# Patient Record
Sex: Male | Born: 1990 | Marital: Single | Smoking: Current some day smoker
Health system: Southern US, Community
[De-identification: ages and names within clinical notes are randomized; demographics above are authoritative.]

---
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
## 2011-01-29 ENCOUNTER — Emergency Department: Payer: Self-pay | Admitting: Unknown Physician Specialty

## 2011-12-15 ENCOUNTER — Emergency Department: Payer: Self-pay | Admitting: Emergency Medicine

## 2011-12-15 LAB — ETHANOL: Ethanol %: 0.25 % — ABNORMAL HIGH (ref 0.000–0.080)

## 2011-12-15 LAB — CBC
MCH: 29.2 pg (ref 26.0–34.0)
MCV: 86 fL (ref 80–100)
Platelet: 255 10*3/uL (ref 150–440)
RDW: 15.2 % — ABNORMAL HIGH (ref 11.5–14.5)
WBC: 10.2 10*3/uL (ref 3.8–10.6)

## 2011-12-15 LAB — BASIC METABOLIC PANEL
Anion Gap: 14 (ref 7–16)
BUN: 12 mg/dL (ref 7–18)
Chloride: 108 mmol/L — ABNORMAL HIGH (ref 98–107)
Co2: 23 mmol/L (ref 21–32)
Glucose: 99 mg/dL (ref 65–99)
Osmolality: 288 (ref 275–301)
Potassium: 3.6 mmol/L (ref 3.5–5.1)

## 2016-01-29 ENCOUNTER — Emergency Department
Admission: EM | Admit: 2016-01-29 | Discharge: 2016-01-29 | Disposition: A | Discharge status: Home / Self Care | Payer: BC Managed Care – PPO | Attending: Emergency Medicine | Admitting: Emergency Medicine

## 2016-01-29 ENCOUNTER — Encounter: Payer: Self-pay | Admitting: Emergency Medicine

## 2016-01-29 ENCOUNTER — Emergency Department: Payer: BC Managed Care – PPO

## 2016-01-29 DIAGNOSIS — Z23 Encounter for immunization: Secondary | ICD-10-CM | POA: Diagnosis not present

## 2016-01-29 DIAGNOSIS — F17200 Nicotine dependence, unspecified, uncomplicated: Secondary | ICD-10-CM | POA: Insufficient documentation

## 2016-01-29 DIAGNOSIS — S0081XA Abrasion of other part of head, initial encounter: Secondary | ICD-10-CM | POA: Diagnosis not present

## 2016-01-29 DIAGNOSIS — Y929 Unspecified place or not applicable: Secondary | ICD-10-CM | POA: Diagnosis not present

## 2016-01-29 DIAGNOSIS — S0093XA Contusion of unspecified part of head, initial encounter: Secondary | ICD-10-CM | POA: Diagnosis not present

## 2016-01-29 DIAGNOSIS — Y9389 Activity, other specified: Secondary | ICD-10-CM | POA: Insufficient documentation

## 2016-01-29 DIAGNOSIS — Y999 Unspecified external cause status: Secondary | ICD-10-CM | POA: Diagnosis not present

## 2016-01-29 DIAGNOSIS — S0990XA Unspecified injury of head, initial encounter: Secondary | ICD-10-CM | POA: Diagnosis present

## 2016-01-29 DIAGNOSIS — S022XXA Fracture of nasal bones, initial encounter for closed fracture: Secondary | ICD-10-CM | POA: Diagnosis not present

## 2016-01-29 MED ORDER — TETANUS-DIPHTH-ACELL PERTUSSIS 5-2.5-18.5 LF-MCG/0.5 IM SUSP
0.5000 mL | Freq: Once | INTRAMUSCULAR | Status: AC
  Administered 2016-01-29: 0.5 mL via INTRAMUSCULAR
  Filled 2016-01-29: qty 0.5

## 2016-01-29 NOTE — Discharge Instructions (Signed)
Head Injury, Adult You have a head injury. Headaches and throwing up (vomiting) are common after a head injury. It should be easy to wake up from sleeping. Sometimes you must stay in the hospital. Most problems happen within the first 24 hours. Side effects may occur up to 7-10 days after the injury.  WHAT ARE THE TYPES OF HEAD INJURIES? Head injuries can be as minor as a bump. Some head injuries can be more severe. More severe head injuries include:  A jarring injury to the brain (concussion).  A bruise of the brain (contusion). This mean there is bleeding in the brain that can cause swelling.  A cracked skull (skull fracture).  Bleeding in the brain that collects, clots, and forms a bump (hematoma). WHEN SHOULD I GET HELP RIGHT AWAY?   You are confused or sleepy.  You cannot be woken up.  You feel sick to your stomach (nauseous) or keep throwing up (vomiting).  Your dizziness or unsteadiness is getting worse.  You have very bad, lasting headaches that are not helped by medicine. Take medicines only as told by your doctor.  You cannot use your arms or legs like normal.  You cannot walk.  You notice changes in the black spots in the center of the colored part of your eye (pupil).  You have clear or bloody fluid coming from your nose or ears.  You have trouble seeing. During the next 24 hours after the injury, you must stay with someone who can watch you. This person should get help right away (call 911 in the [place] if you start to shake and are not able to control it (have seizures), you pass out, or you are unable to wake up. HOW CAN I PREVENT A HEAD INJURY IN THE FUTURE?  Wear seat belts.  Wear a helmet while bike riding and playing sports like football.  Stay away from dangerous activities around the house. WHEN CAN I RETURN TO NORMAL ACTIVITIES AND ATHLETICS? See your doctor before doing these activities. You should not do normal activities or play contact sports until 1  week after the following symptoms have stopped:  Headache that does not go away.  Dizziness.  Poor attention.  Confusion.  Memory problems.  Sickness to your stomach or throwing up.  Tiredness.  Fussiness.  Bothered by bright lights or loud noises.  Anxiousness or depression.  Restless sleep. MAKE SURE YOU:   Understand these instructions.  Will watch your condition.  Will get help right away if you are not doing well or get worse.   This information is not intended to replace advice given to you by your health care provider. Make sure you discuss any questions you have with your health care provider.   Document Released: 09/26/2008 Document Revised: 11/04/2014 Document Reviewed: 06/21/2013 Elsevier Interactive Patient Education 2016 Elsevier Inc.  Nasal Fracture A nasal fracture is a break or crack in the bones or cartilage of the nose. Minor breaks do not require treatment. These breaks usually heal on their own after about one month. Serious breaks may require surgery. CAUSES This injury is usually caused by a blunt injury to the nose. This type of injury often occurs from:  Contact sports.  Car accidents.  Falls.  Getting punched. SYMPTOMS Symptoms of this injury include:  Pain.  Swelling of the nose.  Bleeding from the nose.  Bruising around the nose or eyes. This may include having black eyes.  Crooked appearance of the nose. DIAGNOSIS This injury may  be diagnosed with a physical exam. The health care provider will gently feel the nose for signs of broken bones. He or she will look inside the nostrils to make sure that there is not a blood-filled swelling on the dividing wall between the nostrils (septal hematoma). X-rays of the nose may not show a nasal fracture even when one is present. In some cases, X-rays or a CT scan may be done 1-5 days after the injury. Sometimes, the health care provider will want to wait until the swelling has gone  down. TREATMENT Often, minor fractures that have caused no deformity do not require treatment. More serious fractures in which bones have moved out of position may require surgery, which will take place after the swelling is gone. Surgery will stabilize and align the fracture. In some cases, a health care provider may be able to reposition the bones without surgery. This may be done in the health care provider's office after medicine is given to numb the area (local anesthetic). HOME CARE INSTRUCTIONS  If directed, apply ice to the injured area:  Put ice in a plastic bag.  Place a towel between your skin and the bag.  Leave the ice on for 20 minutes, 2-3 times per day.  Take over-the-counter and prescription medicines only as told by your health care provider.  If your nose starts to bleed, sit in an upright position while you squeeze the soft parts of your nose against the dividing wall between your nostrils (septum) for 10 minutes.  Try to avoid blowing your nose.  Return to your normal activities as told by your health care provider. Ask your health care provider what activities are safe for you.  Avoid contact sports for 3-4 weeks or as told by your health care provider.  Keep all follow-up visits as told by your health care provider. This is important. SEEK MEDICAL CARE IF:  Your pain increases or becomes severe.  You continue to have nosebleeds.  The shape of your nose does not return to normal within 5 days.  You have pus draining out of your nose. SEEK IMMEDIATE MEDICAL CARE IF:  You have bleeding from your nose that does not stop after you pinch your nostrils closed for 20 minutes and keep ice on your nose.  You have clear fluid draining out of your nose.  You notice a grape-like swelling on the septum. This swelling is a collection of blood (hematoma) that must be drained to help prevent infection.  You have difficulty moving your eyes.  You have repeated  vomiting.   This information is not intended to replace advice given to you by your health care provider. Make sure you discuss any questions you have with your health care provider.   Document Released: 10/11/2000 Document Revised: 07/05/2015 Document Reviewed: 11/21/2014 Elsevier Interactive Patient Education [employer]2016 Elsevier Inc.
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
## 2017-06-20 ENCOUNTER — Emergency Department
Admission: EM | Admit: 2017-06-20 | Discharge: 2017-06-20 | Disposition: A | Discharge status: Home / Self Care | Payer: Worker's Compensation | Attending: Emergency Medicine | Admitting: Emergency Medicine

## 2017-06-20 ENCOUNTER — Emergency Department: Payer: Worker's Compensation

## 2017-06-20 ENCOUNTER — Encounter: Payer: Self-pay | Admitting: Emergency Medicine

## 2017-06-20 DIAGNOSIS — S61213A Laceration without foreign body of left middle finger without damage to nail, initial encounter: Secondary | ICD-10-CM | POA: Diagnosis not present

## 2017-06-20 DIAGNOSIS — Y93H3 Activity, building and construction: Secondary | ICD-10-CM | POA: Insufficient documentation

## 2017-06-20 DIAGNOSIS — F17228 Nicotine dependence, chewing tobacco, with other nicotine-induced disorders: Secondary | ICD-10-CM | POA: Diagnosis not present

## 2017-06-20 DIAGNOSIS — S61211A Laceration without foreign body of left index finger without damage to nail, initial encounter: Secondary | ICD-10-CM | POA: Insufficient documentation

## 2017-06-20 DIAGNOSIS — Y998 Other external cause status: Secondary | ICD-10-CM | POA: Insufficient documentation

## 2017-06-20 DIAGNOSIS — W312XXA Contact with powered woodworking and forming machines, initial encounter: Secondary | ICD-10-CM | POA: Insufficient documentation

## 2017-06-20 DIAGNOSIS — F17200 Nicotine dependence, unspecified, uncomplicated: Secondary | ICD-10-CM | POA: Insufficient documentation

## 2017-06-20 DIAGNOSIS — Y929 Unspecified place or not applicable: Secondary | ICD-10-CM | POA: Diagnosis not present

## 2017-06-20 DIAGNOSIS — Z23 Encounter for immunization: Secondary | ICD-10-CM | POA: Diagnosis not present

## 2017-06-20 MED ORDER — TETANUS-DIPHTH-ACELL PERTUSSIS 5-2.5-18.5 LF-MCG/0.5 IM SUSP
0.5000 mL | Freq: Once | INTRAMUSCULAR | Status: AC
  Administered 2017-06-20: 0.5 mL via INTRAMUSCULAR
  Filled 2017-06-20: qty 0.5

## 2017-06-20 MED ORDER — LIDOCAINE HCL (PF) 1 % IJ SOLN
5.0000 mL | Freq: Once | INTRAMUSCULAR | Status: AC
  Administered 2017-06-20: 5 mL via SUBCUTANEOUS

## 2017-06-20 MED ORDER — CEPHALEXIN 500 MG PO CAPS: 500.0000 mg | ORAL_CAPSULE | Freq: Four times a day (QID) | ORAL | 0 refills | Status: AC

## 2017-06-20 MED ORDER — LIDOCAINE HCL (PF) 1 % IJ SOLN
10.0000 mL | Freq: Once | INTRAMUSCULAR | Status: AC
  Administered 2017-06-20: 10 mL via INTRADERMAL
  Filled 2017-06-20: qty 10

## 2017-06-20 MED ORDER — LIDOCAINE HCL (PF) 1 % IJ SOLN
INTRAMUSCULAR | Status: AC
  Administered 2017-06-20: 5 mL via SUBCUTANEOUS
  Filled 2017-06-20: qty 5

## 2017-06-20 MED ORDER — OXYCODONE-ACETAMINOPHEN 5-325 MG PO TABS: 1.0000 | ORAL_TABLET | Freq: Four times a day (QID) | ORAL | 0 refills | Status: AC | PRN
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
## 2017-09-05 ENCOUNTER — Emergency Department
Admission: EM | Admit: 2017-09-05 | Discharge: 2017-09-05 | Disposition: A | Discharge status: Home / Self Care | Payer: BC Managed Care – PPO | Attending: Emergency Medicine | Admitting: Emergency Medicine

## 2017-09-05 ENCOUNTER — Emergency Department: Payer: BC Managed Care – PPO

## 2017-09-05 ENCOUNTER — Other Ambulatory Visit: Payer: Self-pay

## 2017-09-05 DIAGNOSIS — F10220 Alcohol dependence with intoxication, uncomplicated: Secondary | ICD-10-CM | POA: Insufficient documentation

## 2017-09-05 DIAGNOSIS — F1010 Alcohol abuse, uncomplicated: Secondary | ICD-10-CM | POA: Diagnosis not present

## 2017-09-05 DIAGNOSIS — Y908 Blood alcohol level of 240 mg/100 ml or more: Secondary | ICD-10-CM | POA: Diagnosis not present

## 2017-09-05 DIAGNOSIS — R45851 Suicidal ideations: Secondary | ICD-10-CM | POA: Insufficient documentation

## 2017-09-05 DIAGNOSIS — F1994 Other psychoactive substance use, unspecified with psychoactive substance-induced mood disorder: Secondary | ICD-10-CM

## 2017-09-05 DIAGNOSIS — F10920 Alcohol use, unspecified with intoxication, uncomplicated: Secondary | ICD-10-CM

## 2017-09-05 DIAGNOSIS — F17200 Nicotine dependence, unspecified, uncomplicated: Secondary | ICD-10-CM | POA: Diagnosis not present

## 2017-09-05 DIAGNOSIS — F10929 Alcohol use, unspecified with intoxication, unspecified: Secondary | ICD-10-CM | POA: Diagnosis present

## 2017-09-05 LAB — CBC
HCT: 48.6 % (ref 40.0–52.0)
HEMOGLOBIN: 16.8 g/dL (ref 13.0–18.0)
MCH: 28.6 pg (ref 26.0–34.0)
MCHC: 34.6 g/dL (ref 32.0–36.0)
MCV: 82.7 fL (ref 80.0–100.0)
PLATELETS: 315 10*3/uL (ref 150–440)
RBC: 5.88 MIL/uL (ref 4.40–5.90)
RDW: 14.3 % (ref 11.5–14.5)
WBC: 6.4 10*3/uL (ref 3.8–10.6)

## 2017-09-05 LAB — URINE DRUG SCREEN, QUALITATIVE (ARMC ONLY)
AMPHETAMINES, UR SCREEN: NOT DETECTED
Barbiturates, Ur Screen: NOT DETECTED
Benzodiazepine, Ur Scrn: NOT DETECTED
CANNABINOID 50 NG, UR ~~LOC~~: NOT DETECTED
Cocaine Metabolite,Ur ~~LOC~~: NOT DETECTED
MDMA (ECSTASY) UR SCREEN: NOT DETECTED
Methadone Scn, Ur: NOT DETECTED
OPIATE, UR SCREEN: NOT DETECTED
PHENCYCLIDINE (PCP) UR S: NOT DETECTED
Tricyclic, Ur Screen: NOT DETECTED

## 2017-09-05 LAB — COMPREHENSIVE METABOLIC PANEL
ALK PHOS: 59 U/L (ref 38–126)
ALT: 39 U/L (ref 17–63)
AST: 31 U/L (ref 15–41)
Albumin: 5.2 g/dL — ABNORMAL HIGH (ref 3.5–5.0)
Anion gap: 11 (ref 5–15)
BUN: 10 mg/dL (ref 6–20)
CALCIUM: 9.1 mg/dL (ref 8.9–10.3)
CO2: 21 mmol/L — ABNORMAL LOW (ref 22–32)
CREATININE: 1.09 mg/dL (ref 0.61–1.24)
Chloride: 109 mmol/L (ref 101–111)
GFR calc Af Amer: >60 mL/min (ref >60)
Glucose, Bld: 119 mg/dL — ABNORMAL HIGH (ref 65–99)
Potassium: 3.3 mmol/L — ABNORMAL LOW (ref 3.5–5.1)
Sodium: 141 mmol/L (ref 135–145)
Total Bilirubin: 0.8 mg/dL (ref 0.3–1.2)
Total Protein: 8.5 g/dL — ABNORMAL HIGH (ref 6.5–8.1)

## 2017-09-05 LAB — ETHANOL: ALCOHOL ETHYL (B): 329 mg/dL — AB (ref <10)

## 2017-09-05 LAB — SALICYLATE LEVEL: Salicylate Lvl: <7 mg/dL (ref 2.8–30.0)

## 2017-09-05 LAB — ACETAMINOPHEN LEVEL: Acetaminophen (Tylenol), Serum: <10 ug/mL — ABNORMAL LOW (ref 10–30)

## 2017-09-05 MED ORDER — HALOPERIDOL LACTATE 5 MG/ML IJ SOLN
5.0000 mg | Freq: Once | INTRAMUSCULAR | Status: AC
  Administered 2017-09-05: 5 mg via INTRAVENOUS

## 2017-09-05 MED ORDER — LORAZEPAM 2 MG/ML IJ SOLN
1.0000 mg | Freq: Once | INTRAMUSCULAR | Status: AC
  Administered 2017-09-05: 1 mg via INTRAVENOUS

## 2017-09-05 MED ORDER — DIPHENHYDRAMINE HCL 50 MG/ML IJ SOLN
25.0000 mg | Freq: Once | INTRAMUSCULAR | Status: AC
  Administered 2017-09-05: 25 mg via INTRAVENOUS

## 2017-09-05 MED ORDER — DIPHENHYDRAMINE HCL 50 MG/ML IJ SOLN
INTRAMUSCULAR | Status: AC
  Administered 2017-09-05: 25 mg via INTRAVENOUS
  Filled 2017-09-05: qty 1

## 2017-09-05 MED ORDER — SODIUM CHLORIDE 0.9 % IV BOLUS (SEPSIS)
1000.0000 mL | Freq: Once | INTRAVENOUS | Status: AC
  Administered 2017-09-05: 1000 mL via INTRAVENOUS

## 2017-09-05 MED ORDER — LORAZEPAM 2 MG/ML IJ SOLN
INTRAMUSCULAR | Status: AC
  Administered 2017-09-05: 1 mg via INTRAVENOUS
  Filled 2017-09-05: qty 1

## 2017-09-05 MED ORDER — HALOPERIDOL LACTATE 5 MG/ML IJ SOLN
INTRAMUSCULAR | Status: AC
  Administered 2017-09-05: 5 mg via INTRAVENOUS
  Filled 2017-09-05: qty 1

## 2017-09-05 NOTE — BH Assessment (Signed)
TTS and Psych consult to be completed once patient is awake and sober enough to participate.

## 2017-09-05 NOTE — BH Assessment (Signed)
Assessment Note  Erik Mccall is an 26 y.o. male who presents to the ER via law enforcement. Per ER notes, patient voiced SI with plan of been killed by law enforcement. Patient reports of having no memory of what took place prior to arriving. He states he drank a large amount of alcohol and he doesn't know the amount. He further reports of drinking two to three times a week. Upon arrival to the ER, patient was agitated and uncooperative.  With this writer patient denied SI/HI and AV/H. He reports of having one DWI and it was approximately five years ago.  During the interview, he was calm, cooperative and pleasant. He was able to provide appropriate answers to the questions.   Diagnosis: Alcohol Use Disorder, Severe  Past Medical History: History reviewed. No pertinent past medical history.  History reviewed. No pertinent surgical history.  Family History: No family history on file.  Social History:  reports that he has been smoking.  He uses smokeless tobacco. He reports that he drinks alcohol. His drug history is not on file.  Additional Social History:  Alcohol / Drug Use Pain Medications: See PTA Prescriptions: See PTA Over the Counter: See PTA History of alcohol / drug use?: Yes Longest period of sobriety (when/how long): Unable to quantify Substance #1 Name of Substance 1: Alcohol 1 - Age of First Use: 14 1 - Amount (size/oz): "2-40oz's to 15 pack of beer" 1 - Frequency: "One to two timess week" 1 - Duration: Unable to quantify 1 - Last Use / Amount: 09/04/2017  CIWA: CIWA-Ar BP: (!) 115/58 Pulse Rate: 75 COWS:    Allergies: No Known Allergies  Home Medications:  (Not in a hospital admission)  OB/GYN Status:  No LMP for male patient.  General Assessment Data Assessment unable to be completed: Yes [facility] Assessment: In system Is this a Tele or Face-to-Face Assessment?: Face-to-Face Is this an Initial Assessment or a Re-assessment  for this encounter?: Initial Assessment Marital status: Single Maiden name: n/a Is patient pregnant?: No Pregnancy Status: No Living Arrangements: Alone Can pt return to current living arrangement?: Yes Admission Status: Involuntary Is patient capable of signing voluntary admission?: No(Under IVC) Referral Source: Self/Family/Friend Insurance type: Scientist, research (physical sciences)BCBS  Medical Screening Exam [facility] ONLY) Medical Exam completed: Yes  Crisis Care Plan Living Arrangements: Alone Legal Guardian: Other:(Self) Name of Psychiatrist: Reports of none Name of Therapist: Reports of none  Education Status Is patient currently in school?: No Current Grade: n/a Highest grade of school patient has completed: n/a Name of school: n/a Contact person: n/a  Risk to self with the past 6 months Suicidal Ideation: No Has patient been a risk to self within the past 6 months prior to admission? : No Suicidal Intent: No Has patient had any suicidal intent within the past 6 months prior to admission? : No Is patient at risk for suicide?: No Suicidal Plan?: No Has patient had any suicidal plan within the past 6 months prior to admission? : No Access to Means: No What has been your use of drugs/alcohol within the last 12 months?: Alcohol Previous Attempts/Gestures: No How many times?: 0 Other Self Harm Risks: Active Addiction Triggers for Past Attempts: None known Intentional Self Injurious Behavior: None Family Suicide History: No Recent stressful life event(s): Other (Comment)(Alcohol Use) Persecutory voices/beliefs?: No Depression: No Depression Symptoms: Isolating Substance abuse history and/or treatment for substance abuse?: Yes Suicide prevention information given to non-admitted patients: Not applicable  Risk to  Others within the past 6 months Homicidal Ideation: No Does patient have any lifetime risk of violence toward others beyond the six months prior to admission? : No Thoughts of Harm to  Others: No Current Homicidal Intent: No Current Homicidal Plan: No Access to Homicidal Means: No Identified Victim: Reports of none History of harm to others?: No Assessment of Violence: None Noted Violent Behavior Description: Reports of none Does patient have access to weapons?: No Criminal Charges Pending?: No Does patient have a court date: No Is patient on probation?: Yes  Psychosis Hallucinations: None noted Delusions: None noted  Mental Status Report Appearance/Hygiene: Unremarkable, In scrubs Eye Contact: Good Motor Activity: Freedom of movement, Unremarkable Speech: Logical/coherent, Unremarkable Level of Consciousness: Alert Mood: Pleasant Affect: Appropriate to circumstance Anxiety Level: None Thought Processes: Coherent, Relevant Judgement: Unimpaired Orientation: Person, Place, Time, Situation, Appropriate for developmental age Obsessive Compulsive Thoughts/Behaviors: None  Cognitive Functioning Concentration: Normal Memory: Remote Intact, Recent Impaired IQ: Average Insight: Fair Impulse Control: Fair Appetite: Good Weight Loss: 0 Weight Gain: 0 Sleep: No Change Total Hours of Sleep: 7 Vegetative Symptoms: None  ADLScreening [facility] Assessment Services) Patient's cognitive ability adequate to safely complete daily activities?: Yes Patient able to express need for assistance with ADLs?: Yes Independently performs ADLs?: Yes (appropriate for developmental age)  Prior Inpatient Therapy Prior Inpatient Therapy: No Prior Therapy Dates: Reports of none Prior Therapy Facilty/Provider(s): Reports of none Reason for Treatment: Reports of none  Prior Outpatient Therapy Prior Outpatient Therapy: Yes Prior Therapy Dates: 2013 Prior Therapy Facilty/Provider(s): Unable to remember the name Reason for Treatment: DWI Classes Does patient have an ACCT team?: No Does patient have [facility] services? : No Does patient have P4CC services?: No  ADL Screening  (condition at time of admission) Patient's cognitive ability adequate to safely complete daily activities?: Yes Is the patient deaf or have difficulty hearing?: No Does the patient have difficulty concentrating, remembering, or making decisions?: No Patient able to express need for assistance with ADLs?: Yes Does the patient have difficulty dressing or bathing?: No Independently performs ADLs?: Yes (appropriate for developmental age) Does the patient have difficulty walking or climbing stairs?: No Weakness of Legs: None Weakness of Arms/Hands: None     Therapy Consults (therapy consults require a physician order) PT Evaluation Needed: No OT Evalulation Needed: No SLP Evaluation Needed: No       Advance Directives (For Healthcare) Does Patient Have a Medical Advance Directive?: No    Additional Information 1:1 In Past 12 Months?: No CIRT Risk: No Elopement Risk: No Does patient have medical clearance?: Yes  Child/Adolescent Assessment Running Away Risk: Denies(Patient is an adult)  Disposition:  Disposition Initial Assessment Completed for this Encounter: Yes Disposition of Patient: Pending Review with psychiatrist  On Site Evaluation by:   Reviewed with Physician:    Lilyan Gilfordalvin J. Charlcie Prisco MS, LCAS, LPC, NCC, CCSI Therapeutic Triage Specialist 09/05/2017 1:43 PM

## 2017-09-05 NOTE — ED Notes (Signed)
Pt lying in bed, appears to be asleep with even, unlabored respirations observed. Safety maintained. Will continue to monitor.

## 2017-09-05 NOTE — Discharge Instructions (Signed)
Please do not drink at all, or drink in moderation.  Please talk to your primary care physician about treating your alcohol use disorder.  Return to the emergency department if you develop severe pain, seizures, thoughts of hurting herself or anyone else, hallucinations, or any other symptoms concerning to you.

## 2017-09-05 NOTE — ED Notes (Addendum)
Dr Dolores FrameSung made aware of pt's critical ETOH as reported by lab: 329 mg/dL
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
## 2017-09-05 NOTE — ED Notes (Signed)
Pt to ED-[facility] room 7. Pt already asking how long he must stay and requesting discharge. Denies SI/HI/AVH, pain. Reports "I was just being stupid while I was drunk." Reports "I get drunk with my friends about 2 times a week when we play video games." Pt sarcastic with poor eye contact. Abrasion seen on right hand, bandages applied. Informed pt of safety checks every 15 minutes and security cameras in place. Verbalized understanding. Safety maintained. Will continue to monitor.

## 2017-09-05 NOTE — ED Notes (Signed)
Food/fluids provided. Awaiting discharge order/paperwork to send patient home. Safety maintained. Will continue to monitor.

## 2017-09-05 NOTE — ED Notes (Signed)
Pt continues to appear to be asleep with even, unlabored respirations. Safety maintained. Will continue to monitor.

## 2017-09-05 NOTE — ED Notes (Signed)
Pt lying in bed awake. Calm/cooperatvie at this time. Fluids provided. Safety maintained. Will continue to monitor.

## 2017-09-05 NOTE — ED Notes (Signed)
Provided and reviewed discharge paperwork. Pt verbalizes understanding. Denies SI/HI/AVH, pain. Belongings returned. Safety maintained. Pt's grandmother here to pick him up and transport home.

## 2017-09-05 NOTE — ED Notes (Signed)
Pt lying in bed awake, calm/cooperative at this time. Safety maintained. Will continue to monitor.

## 2017-09-05 NOTE — Consult Note (Signed)
[facility] Consult   Reason for Consult: Consult for 26 year old man who presented to the emergency room intoxicated making suicidal statements Referring Physician: Mariea Clonts Patient Identification: REEGAN Mccall MRN:  [id number] Principal Diagnosis: Alcohol abuse Diagnosis:   Patient Active Problem List   Diagnosis Date Noted  . Alcohol abuse [F10.10] 09/05/2017  . Substance induced mood disorder [facility]) [F19.94] 09/05/2017    Total Time spent with patient: 1 hour  Subjective:   Erik Mccall is a 26 y.o. male patient admitted with "I guess I drank too much".  HPI: Patient interviewed chart reviewed.  26 year old man presented last night extremely intoxicated blood alcohol level up around 400.  Reportedly making statements about wanting to "commit suicide by cop".  Patient has slept all day.  On interview today he says he has no memory of the circumstances of presentation.  He remembers the police picking him up at his home.  He has no memory as to why the police were called.  He admits that he drank an entire 750 mL bottle of liquor last night which is about twice his usual consumption.  Denies that he is using any other drugs.  Patient denies depression denies any other depressive symptoms denies any psychosis.  Completely denies any suicidal or homicidal ideation.  Social history: Lives with his family.  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  Medical history: No significant ongoing medical problems  Substance abuse history: Long-standing alcohol abuse with problems such as arrest and driving while intoxicated.  Has never made a long-term commitment to sobriety but has stopped for months at a time in the past.  Denies other drug use.  Past Psychiatric History: No history of suicide attempts no history of psychiatric hospitalization no history of psychiatric medicine.  No prior contact with mental health treatment.  Risk to Self: Suicidal Ideation:  No Suicidal Intent: No Is patient at risk for suicide?: No Suicidal Plan?: No Access to Means: No What has been your use of drugs/alcohol within the last 12 months?: Alcohol How many times?: 0 Other Self Harm Risks: Active Addiction Triggers for Past Attempts: None known Intentional Self Injurious Behavior: None Risk to Others: Homicidal Ideation: No Thoughts of Harm to Others: No Current Homicidal Intent: No Current Homicidal Plan: No Access to Homicidal Means: No Identified Victim: Reports of none History of harm to others?: No Assessment of Violence: None Noted Violent Behavior Description: Reports of none Does patient have access to weapons?: No Criminal Charges Pending?: No Does patient have a court date: No Prior Inpatient Therapy: Prior Inpatient Therapy: No Prior Therapy Dates: Reports of none Prior Therapy Facilty/Provider(s): Reports of none Reason for Treatment: Reports of none Prior Outpatient Therapy: Prior Outpatient Therapy: Yes Prior Therapy Dates: 2013 Prior Therapy Facilty/Provider(s): Unable to remember the name Reason for Treatment: DWI Classes Does patient have an ACCT team?: No Does patient have [facility] services? : No Does patient have P4CC services?: No  Past Medical History: History reviewed. No pertinent past medical history. History reviewed. No pertinent surgical history. Family History: No family history on file. Family Psychiatric  History: Sister has depression Social History:  Social History   Substance and Sexual Activity  Alcohol Use Yes     Social History   Substance and Sexual Activity  Drug Use Not on file    Social History   Socioeconomic History  . Marital status: Single    Spouse name: None  . Number of children: None  .  Years of education: None  . Highest education level: None  Social Needs  . Financial resource strain: None  . Food insecurity - worry: None  . Food insecurity - inability: None  . Transportation needs  - medical: None  . Transportation needs - non-medical: None  Occupational History  . None  Tobacco Use  . Smoking status: Current Some Day Smoker  . Smokeless tobacco: Current User  Substance and Sexual Activity  . Alcohol use: Yes  . Drug use: None  . Sexual activity: Yes  Other Topics Concern  . None  Social History Narrative  . None   Additional Social History:    Allergies:  No Known Allergies  Labs:  Results for orders placed or performed during the hospital encounter of 09/05/17 (from the past 48 hour(s))  Comprehensive metabolic panel     Status: Abnormal   Collection Time: 09/05/17  4:39 AM  Result Value Ref Range   Sodium 141 135 - 145 mmol/L   Potassium 3.3 (L) 3.5 - 5.1 mmol/L   Chloride 109 101 - 111 mmol/L   CO2 21 (L) 22 - 32 mmol/L   Glucose, Bld 119 (H) 65 - 99 mg/dL   BUN 10 6 - 20 mg/dL   Creatinine, Ser 1.09 0.61 - 1.24 mg/dL   Calcium 9.1 8.9 - 10.3 mg/dL   Total Protein 8.5 (H) 6.5 - 8.1 g/dL   Albumin 5.2 (H) 3.5 - 5.0 g/dL   AST 31 15 - 41 U/L   ALT 39 17 - 63 U/L   Alkaline Phosphatase 59 38 - 126 U/L   Total Bilirubin 0.8 0.3 - 1.2 mg/dL   GFR calc non Af Amer >60 >60 mL/min   GFR calc Af Amer >60 >60 mL/min    Comment: (NOTE) The eGFR has been calculated using the CKD EPI equation. This calculation has not been validated in all clinical situations. eGFR's persistently <60 mL/min signify possible Chronic Kidney Disease.    Anion gap 11 5 - 15  Ethanol     Status: Abnormal   Collection Time: 09/05/17  4:39 AM  Result Value Ref Range   Alcohol, Ethyl (B) 329 (HH) <10 mg/dL    Comment: CRITICAL RESULT CALLED TO, READ BACK BY AND VERIFIED WITH  BUTCH WOODS @ 0517 ON 09/05/2017 BY [facility]        LOWEST DETECTABLE LIMIT FOR SERUM ALCOHOL IS 10 mg/dL FOR MEDICAL PURPOSES ONLY   Salicylate level     Status: None   Collection Time: 09/05/17  4:39 AM  Result Value Ref Range   Salicylate Lvl <4.2 2.8 - 30.0 mg/dL  Acetaminophen level      Status: Abnormal   Collection Time: 09/05/17  4:39 AM  Result Value Ref Range   Acetaminophen (Tylenol), Serum <10 (L) 10 - 30 ug/mL    Comment:        THERAPEUTIC CONCENTRATIONS VARY SIGNIFICANTLY. A RANGE OF 10-30 ug/mL MAY BE AN EFFECTIVE CONCENTRATION FOR MANY PATIENTS. HOWEVER, SOME ARE BEST TREATED AT CONCENTRATIONS OUTSIDE THIS RANGE. ACETAMINOPHEN CONCENTRATIONS >150 ug/mL AT 4 HOURS AFTER INGESTION AND >50 ug/mL AT 12 HOURS AFTER INGESTION ARE OFTEN ASSOCIATED WITH TOXIC REACTIONS.   cbc     Status: None   Collection Time: 09/05/17  4:39 AM  Result Value Ref Range   WBC 6.4 3.8 - 10.6 K/uL   RBC 5.88 4.40 - 5.90 MIL/uL   Hemoglobin 16.8 13.0 - 18.0 g/dL   HCT 48.6 40.0 - 52.0 %  MCV 82.7 80.0 - 100.0 fL   MCH 28.6 26.0 - 34.0 pg   MCHC 34.6 32.0 - 36.0 g/dL   RDW 14.3 11.5 - 14.5 %   Platelets 315 150 - 440 K/uL  Urine Drug Screen, Qualitative     Status: None   Collection Time: 09/05/17  4:39 AM  Result Value Ref Range   Tricyclic, Ur Screen NONE DETECTED NONE DETECTED   Amphetamines, Ur Screen NONE DETECTED NONE DETECTED   MDMA (Ecstasy)Ur Screen NONE DETECTED NONE DETECTED   Cocaine Metabolite,Ur [place] NONE DETECTED NONE DETECTED   Opiate, Ur Screen NONE DETECTED NONE DETECTED   Phencyclidine (PCP) Ur S NONE DETECTED NONE DETECTED   Cannabinoid 50 Ng, Ur Needles NONE DETECTED NONE DETECTED   Barbiturates, Ur Screen NONE DETECTED NONE DETECTED   Benzodiazepine, Ur Scrn NONE DETECTED NONE DETECTED   Methadone Scn, Ur NONE DETECTED NONE DETECTED    Comment: (NOTE) 258  Tricyclics, urine               Cutoff 1000 ng/mL 200  Amphetamines, urine             Cutoff 1000 ng/mL 300  MDMA (Ecstasy), urine           Cutoff 500 ng/mL 400  Cocaine Metabolite, urine       Cutoff 300 ng/mL 500  Opiate, urine                   Cutoff 300 ng/mL 600  Phencyclidine (PCP), urine      Cutoff 25 ng/mL 700  Cannabinoid, urine              Cutoff 50 ng/mL 800  Barbiturates, urine              Cutoff 200 ng/mL 900  Benzodiazepine, urine           Cutoff 200 ng/mL 1000 Methadone, urine                Cutoff 300 ng/mL 1100 1200 The urine drug screen provides only a preliminary, unconfirmed 1300 analytical test result and should not be used for non-medical 1400 purposes. Clinical consideration and professional judgment should 1500 be applied to any positive drug screen result due to possible 1600 interfering substances. A more specific alternate chemical method 1700 must be used in order to obtain a confirmed analytical result.  1800 Gas chromato graphy / mass spectrometry (GC/MS) is the preferred 1900 confirmatory method.     No current facility-administered medications for this encounter.    Current Outpatient Medications  Medication Sig Dispense Refill  . oxyCODONE-acetaminophen (ROXICET) 5-325 MG tablet Take 1 tablet by mouth every 6 (six) hours as needed. 10 tablet 0    Musculoskeletal: Strength & Muscle Tone: within normal limits Gait & Station: normal Patient leans: N/A  Psychiatric Specialty Exam: Physical Exam  Nursing note and vitals reviewed. Constitutional: He appears well-developed and well-nourished.  HENT:  Head: Normocephalic and atraumatic.  Eyes: Conjunctivae are normal. Pupils are equal, round, and reactive to light.  Neck: Normal range of motion.  Cardiovascular: Regular rhythm and normal heart sounds.  Respiratory: Effort normal. No respiratory distress.  GI: Soft.  Musculoskeletal: Normal range of motion.  Neurological: He is alert.  Skin: Skin is warm and dry.  Psychiatric: His speech is normal and behavior is normal. Judgment and thought content normal. His affect is blunt. He exhibits abnormal recent memory.    Review of Systems  Constitutional: Negative.  HENT: Negative.   Eyes: Negative.   Respiratory: Negative.   Cardiovascular: Negative.   Gastrointestinal: Negative.   Musculoskeletal: Negative.   Skin: Negative.    Neurological: Negative.   Psychiatric/Behavioral: Positive for memory loss and substance abuse. Negative for depression, hallucinations and suicidal ideas. The patient is not nervous/anxious and does not have insomnia.     Blood pressure (!) 114/50, pulse 76, temperature 98.4 F (36.9 C), temperature source Oral, resp. rate 18, height 6' 4"  (1.93 m), weight 124.7 kg (275 lb), SpO2 98 %.Body mass index is 33.47 kg/m.  General Appearance: Disheveled  Eye Contact:  Fair  Speech:  Clear and Coherent  Volume:  Decreased  Mood:  Euthymic  Affect:  Constricted  Thought Process:  Coherent  Orientation:  Full (Time, Place, and Person)  Thought Content:  Logical  Suicidal Thoughts:  No  Homicidal Thoughts:  No  Memory:  Immediate;   Fair Recent;   Poor Remote;   Poor  Judgement:  Impaired  Insight:  Shallow  Psychomotor Activity:  Decreased  Concentration:  Concentration: Poor  Recall:  Poor  Fund of Knowledge:  Fair  Language:  Fair  Akathisia:  No  Handed:  Right  AIMS (if indicated):     Assets:  Housing Physical Health  ADL's:  Intact  Cognition:  WNL  Sleep:        Treatment Plan Summary: Plan 26 year old man who presented intoxicated allegedly talking about suicide by cop last night.  No evidence that he actually did anything to try to kill himself.  Completely denies suicidal ideation today.  Denies homicidal ideation.  Does not report any recent symptoms of depression psychosis or mania.  Appears to have just been intoxicated last night.  Not tremulous or delirious currently.  Patient was counseled about the obvious dangers of blackouts while drinking and strongly encouraged to consider getting back into substance abuse treatment.  Discontinue IVC.  Patient can be released from the emergency room.  Disposition: Patient does not meet criteria for psychiatric inpatient admission.  Alethia Berthold, MD 09/05/2017 5:33 PM

## 2017-09-05 NOTE — ED Triage Notes (Signed)
Pt arrives to ED via [facility] with c/o ETOH intoxication and threats of wanting to commit "suicide by cop". Pt arrives extremely intoxicated, somewhat uncooperative. Bilateral hands are covered with dried blood, large scratch to RIGHT chest, and abrasions on his back. Per [facility], pt is IVC'd.

## 2017-10-31 DIAGNOSIS — R45851 Suicidal ideations: Secondary | ICD-10-CM | POA: Insufficient documentation

## 2017-10-31 DIAGNOSIS — F10129 Alcohol abuse with intoxication, unspecified: Secondary | ICD-10-CM | POA: Diagnosis present

## 2017-10-31 DIAGNOSIS — F1994 Other psychoactive substance use, unspecified with psychoactive substance-induced mood disorder: Secondary | ICD-10-CM | POA: Insufficient documentation

## 2017-10-31 DIAGNOSIS — F1010 Alcohol abuse, uncomplicated: Secondary | ICD-10-CM | POA: Insufficient documentation

## 2017-10-31 DIAGNOSIS — F17200 Nicotine dependence, unspecified, uncomplicated: Secondary | ICD-10-CM | POA: Insufficient documentation

## 2017-10-31 DIAGNOSIS — F17228 Nicotine dependence, chewing tobacco, with other nicotine-induced disorders: Secondary | ICD-10-CM | POA: Diagnosis not present

## 2017-11-01 ENCOUNTER — Encounter: Payer: Self-pay | Admitting: Emergency Medicine

## 2017-11-01 ENCOUNTER — Emergency Department
Admission: EM | Admit: 2017-11-01 | Discharge: 2017-11-01 | Disposition: A | Discharge status: Home / Self Care | Payer: BC Managed Care – PPO | Attending: Emergency Medicine | Admitting: Emergency Medicine

## 2017-11-01 ENCOUNTER — Other Ambulatory Visit: Payer: Self-pay

## 2017-11-01 DIAGNOSIS — F1994 Other psychoactive substance use, unspecified with psychoactive substance-induced mood disorder: Secondary | ICD-10-CM

## 2017-11-01 DIAGNOSIS — F10920 Alcohol use, unspecified with intoxication, uncomplicated: Secondary | ICD-10-CM

## 2017-11-01 DIAGNOSIS — F1010 Alcohol abuse, uncomplicated: Secondary | ICD-10-CM

## 2017-11-01 LAB — COMPREHENSIVE METABOLIC PANEL
ALT: 34 U/L (ref 17–63)
AST: 37 U/L (ref 15–41)
Albumin: 5 g/dL (ref 3.5–5.0)
Alkaline Phosphatase: 67 U/L (ref 38–126)
Anion gap: 13 (ref 5–15)
BUN: 8 mg/dL (ref 6–20)
CHLORIDE: 107 mmol/L (ref 101–111)
CO2: 22 mmol/L (ref 22–32)
Calcium: 8.7 mg/dL — ABNORMAL LOW (ref 8.9–10.3)
Creatinine, Ser: 0.98 mg/dL (ref 0.61–1.24)
Glucose, Bld: 111 mg/dL — ABNORMAL HIGH (ref 65–99)
POTASSIUM: 3.6 mmol/L (ref 3.5–5.1)
Sodium: 142 mmol/L (ref 135–145)
Total Bilirubin: 0.5 mg/dL (ref 0.3–1.2)
Total Protein: 8.3 g/dL — ABNORMAL HIGH (ref 6.5–8.1)

## 2017-11-01 LAB — URINE DRUG SCREEN, QUALITATIVE (ARMC ONLY)
AMPHETAMINES, UR SCREEN: NOT DETECTED
Barbiturates, Ur Screen: NOT DETECTED
Benzodiazepine, Ur Scrn: NOT DETECTED
Cannabinoid 50 Ng, Ur ~~LOC~~: NOT DETECTED
Cocaine Metabolite,Ur ~~LOC~~: NOT DETECTED
MDMA (ECSTASY) UR SCREEN: NOT DETECTED
Methadone Scn, Ur: NOT DETECTED
Opiate, Ur Screen: NOT DETECTED
Phencyclidine (PCP) Ur S: NOT DETECTED
TRICYCLIC, UR SCREEN: NOT DETECTED

## 2017-11-01 LAB — CBC
HCT: 49.7 % (ref 40.0–52.0)
Hemoglobin: 17 g/dL (ref 13.0–18.0)
MCH: 28.6 pg (ref 26.0–34.0)
MCHC: 34.3 g/dL (ref 32.0–36.0)
MCV: 83.3 fL (ref 80.0–100.0)
PLATELETS: 293 10*3/uL (ref 150–440)
RBC: 5.97 MIL/uL — AB (ref 4.40–5.90)
RDW: 14.6 % — ABNORMAL HIGH (ref 11.5–14.5)
WBC: 6.5 10*3/uL (ref 3.8–10.6)

## 2017-11-01 LAB — SALICYLATE LEVEL

## 2017-11-01 LAB — ETHANOL: ALCOHOL ETHYL (B): 349 mg/dL — AB (ref <10)

## 2017-11-01 LAB — ACETAMINOPHEN LEVEL: Acetaminophen (Tylenol), Serum: <10 ug/mL — ABNORMAL LOW (ref 10–30)

## 2017-11-01 MED ORDER — GI COCKTAIL ~~LOC~~
30.0000 mL | Freq: Once | ORAL | Status: AC
  Administered 2017-11-01: 30 mL via ORAL
  Filled 2017-11-01: qty 30

## 2017-11-01 MED ORDER — ZIPRASIDONE MESYLATE 20 MG IM SOLR
10.0000 mg | Freq: Once | INTRAMUSCULAR | Status: AC
  Administered 2017-11-01: 10 mg via INTRAMUSCULAR

## 2017-11-01 MED ORDER — SODIUM CHLORIDE 0.9 % IV BOLUS (SEPSIS)
1000.0000 mL | Freq: Once | INTRAVENOUS | Status: AC
Start: 2017-11-01 — End: 2017-11-01
  Administered 2017-11-01: 1000 mL via INTRAVENOUS

## 2017-11-01 MED ORDER — THIAMINE HCL 100 MG/ML IJ SOLN
Freq: Once | INTRAVENOUS | Status: AC
  Administered 2017-11-01: 01:00:00 via INTRAVENOUS
  Filled 2017-11-01: qty 1000

## 2017-11-01 MED ORDER — VITAMIN B-1 100 MG PO TABS
100.0000 mg | ORAL_TABLET | Freq: Every day | ORAL | Status: DC
  Administered 2017-11-01: 100 mg via ORAL
  Filled 2017-11-01: qty 1

## 2017-11-01 MED ORDER — LORAZEPAM 2 MG/ML IJ SOLN: 0.0000 mg | Freq: Four times a day (QID) | INTRAMUSCULAR | Status: DC

## 2017-11-01 MED ORDER — ZIPRASIDONE MESYLATE 20 MG IM SOLR
INTRAMUSCULAR | Status: AC
  Administered 2017-11-01: 10 mg via INTRAMUSCULAR
  Filled 2017-11-01: qty 20

## 2017-11-01 NOTE — ED Notes (Signed)
Requested SOC computer from [facility] 

## 2017-11-01 NOTE — ED Notes (Signed)
[facility]  [phone]

## 2017-11-01 NOTE — ED Notes (Signed)
[facility]

## 2017-11-01 NOTE — Discharge Instructions (Signed)
Please follow-up for alcohol and substance abuse as well as any depression or anxiety or suicidal thoughts with outpatient psychiatry, recommended and given contact information for [facility].  Return to emerge department immediately for any worsening condition including any thoughts of wanting to hurt yourself or others.

## 2017-11-01 NOTE — ED Triage Notes (Signed)
Per Sheriffs office responded to pt intoxicated and sitting road wanting to get hit by a car.  Pt told police that pt has a gun and wanted LE to shoot him.

## 2017-11-01 NOTE — ED Notes (Signed)
SOC computer placed in patient's room

## 2017-11-01 NOTE — BH Assessment (Signed)
Assessment Note  Erik Mccall is an 27 y.o. male who presents to the ER due to voicing SI. Per the report of the patient, he's not suicidal and doesn't remember saying he was. However, he admits to when he is intoxicated, he doesn't remember what happens. Patient was recently in the ER (08/2017) with similar presentation. He became intoxicated and voiced SI. Patient initially reported he was unsure who called 911. However, while sharing what he remembered; leading up to arriving in the ER, he corrected it and said he spoke with the dispatcher, "[employer]on [employer] [place]. Yea, he was a asshole.So yes, I guess I did call them (911), hm. I need to stop drinking..."  During the interview, the patient was calm, cooperative and pleasant. He was able to acknowledge his alcohol use is problematic. "I need to leave that shit alone." Per his report, he typically drinks one to two time week. However, for the last two days he's drank two pints of liquor and "a lot of beers." He denies the use of any other mind-altering substances. He's tried "acid" once and used cannabis in the past. Due to receiving a possession charge, he no longer smoke THC. "I only fool with the legal shit." UDS reflects the same, negative of any substances. Upon arrival to the ER, BAC was 349. Previous ER visit, 08/2017, it was 329.  Patient currently denies SI/HI and AV/H.  Diagnosis: Alcohol Use Disorder, Severe  Past Medical History: History reviewed. No pertinent past medical history.  History reviewed. No pertinent surgical history.  Family History: History reviewed. No pertinent family history.  Social History:  reports that he has been smoking.  His smokeless tobacco use includes chew and snuff. He reports that he drinks alcohol. His drug history is not on file.  Additional Social History:  Alcohol / Drug Use Pain Medications: See PTA Prescriptions: See PTA Over the Counter: See PTA History of alcohol / drug use?: Yes Longest period  of sobriety (when/how long): Unable to quantify Negative Consequences of Use: Financial, Legal, Personal relationships, Work / School Substance #1 Name of Substance 1: Alcohol 1 - Age of First Use: 14 1 - Amount (size/oz): "2-40oz's to 15 pack of beer" 1 - Frequency: "One to two timess week" 1 - Duration: Unable to quantify 1 - Last Use / Amount: 11/01/2016  CIWA: CIWA-Ar BP: 140/90 Pulse Rate: 86 Nausea and Vomiting: no nausea and no vomiting Tactile Disturbances: none Tremor: no tremor Auditory Disturbances: not present Paroxysmal Sweats: no sweat visible Visual Disturbances: not present Anxiety: no anxiety, at ease Headache, Fullness in Head: none present Agitation: normal activity Orientation and Clouding of Sensorium: oriented and can do serial additions CIWA-Ar Total: 0 COWS:    Allergies: No Known Allergies  Home Medications:  (Not in a hospital admission)  OB/GYN Status:  No LMP for male patient.  General Assessment Data Location of Assessment: [facility] ED TTS Assessment: In system Is this a Tele or Face-to-Face Assessment?: Face-to-Face Is this an Initial Assessment or a Re-assessment for this encounter?: Initial Assessment Marital status: Single Maiden name: n/a Is patient pregnant?: No Pregnancy Status: No Living Arrangements: Spouse/significant other, Parent, Other (Comment)(Grandparents) Can pt return to current living arrangement?: Yes Admission Status: Involuntary Is patient capable of signing voluntary admission?: No(Under IVC) Referral Source: Self/Family/Friend Insurance type: Scientist, research (physical sciences) Exam [facility] Walk-in ONLY) Medical Exam completed: Yes  Crisis Care Plan Living Arrangements: Spouse/significant other, Parent, Other (Comment)(Grandparents) Legal Guardian: Other:(Self) Name of Psychiatrist: Reports of none  Name of Therapist: Reports of none  Education Status Is patient currently in school?: No Current Grade: n/a Highest grade of  school patient has completed: n/a Name of school: n/a Contact person: n/a  Risk to self with the past 6 months Suicidal Ideation: No Has patient been a risk to self within the past 6 months prior to admission? : No Suicidal Intent: No Has patient had any suicidal intent within the past 6 months prior to admission? : No Is patient at risk for suicide?: No Suicidal Plan?: No Has patient had any suicidal plan within the past 6 months prior to admission? : No Access to Means: No What has been your use of drugs/alcohol within the last 12 months?: Alcohol Previous Attempts/Gestures: No How many times?: 0 Other Self Harm Risks: Active Addiction Triggers for Past Attempts: None known Intentional Self Injurious Behavior: None Family Suicide History: No Recent stressful life event(s): Other (Comment)(Active Addiction) Persecutory voices/beliefs?: No Depression: No Depression Symptoms: (Reports of none) Substance abuse history and/or treatment for substance abuse?: Yes Suicide prevention information given to non-admitted patients: Not applicable  Risk to Others within the past 6 months Homicidal Ideation: No Does patient have any lifetime risk of violence toward others beyond the six months prior to admission? : No Thoughts of Harm to Others: No Current Homicidal Intent: No Current Homicidal Plan: No Access to Homicidal Means: No Identified Victim: Reports of none History of harm to others?: No Assessment of Violence: None Noted Violent Behavior Description: Reports of none Does patient have access to weapons?: No Criminal Charges Pending?: No Does patient have a court date: No Is patient on probation?: Yes  Psychosis Hallucinations: None noted Delusions: None noted  Mental Status Report Appearance/Hygiene: Unremarkable, In scrubs Eye Contact: Good Motor Activity: Freedom of movement, Unremarkable Speech: Logical/coherent, Unremarkable Level of Consciousness: Alert Mood:  Depressed, Anxious, Sad, Pleasant Affect: Appropriate to circumstance Anxiety Level: Minimal Thought Processes: Coherent, Relevant Judgement: Unimpaired Orientation: Person, Place, Time, Situation, Appropriate for developmental age Obsessive Compulsive Thoughts/Behaviors: None  Cognitive Functioning Concentration: Normal Memory: Remote Intact, Recent Impaired IQ: Average Insight: Fair Impulse Control: Fair Appetite: Good Weight Loss: 0 Weight Gain: 0 Sleep: No Change Total Hours of Sleep: 8 Vegetative Symptoms: None  ADLScreening [facility] Assessment Services) Patient's cognitive ability adequate to safely complete daily activities?: Yes Patient able to express need for assistance with ADLs?: Yes Independently performs ADLs?: Yes (appropriate for developmental age)  Prior Inpatient Therapy Prior Inpatient Therapy: No Prior Therapy Dates: Reports of none Prior Therapy Facilty/Provider(s): Reports of none Reason for Treatment: Reports of none  Prior Outpatient Therapy Prior Outpatient Therapy: Yes Prior Therapy Dates: 2013 Prior Therapy Facilty/Provider(s): Unable to remember the name Reason for Treatment: DWI Classes Does patient have an ACCT team?: No Does patient have Intensive In-House Services?  : No Does patient have [facility] services? : No Does patient have P4CC services?: No  ADL Screening (condition at time of admission) Patient's cognitive ability adequate to safely complete daily activities?: Yes Is the patient deaf or have difficulty hearing?: No Does the patient have difficulty seeing, even when wearing glasses/contacts?: No Does the patient have difficulty concentrating, remembering, or making decisions?: No Patient able to express need for assistance with ADLs?: Yes Does the patient have difficulty dressing or bathing?: No Independently performs ADLs?: Yes (appropriate for developmental age) Does the patient have difficulty walking or climbing stairs?:  No Weakness of Legs: None Weakness of Arms/Hands: None  Home Assistive Devices/Equipment Home Assistive Devices/Equipment: None  Therapy Consults (therapy consults require a physician order) PT Evaluation Needed: No OT Evalulation Needed: No SLP Evaluation Needed: No Abuse/Neglect Assessment (Assessment to be complete while patient is alone) Abuse/Neglect Assessment Can Be Completed: Yes Physical Abuse: Denies Verbal Abuse: Denies Sexual Abuse: Denies Exploitation of patient/patient's resources: Denies Self-Neglect: Denies Values / Beliefs Cultural Requests During Hospitalization: None Spiritual Requests During Hospitalization: None Consults Spiritual Care Consult Needed: No Social Work Consult Needed: No Merchant navy officer (For Healthcare) Does Patient Have a Medical Advance Directive?: No    Additional Information 1:1 In Past 12 Months?: No CIRT Risk: No Elopement Risk: No Does patient have medical clearance?: Yes  Child/Adolescent Assessment Running Away Risk: Denies(Patient is an adult)  Disposition:  Disposition Initial Assessment Completed for this Encounter: Yes Disposition of Patient: Pending Review with psychiatrist  On Site Evaluation by:   Reviewed with Physician:    Lilyan Gilford MS, LCAS, LPC, NCC, CCSI Therapeutic Triage Specialist 11/01/2017 10:30 AM

## 2017-11-01 NOTE — ED Notes (Signed)
Patient stated "I need to be out of here by 9 am so I can go to work, I don't want to be late" Patient was informed he could get a work excuse.  Shortly thereafter patient stated I need to get out of here so I can get my truck loaded, [place]

## 2017-11-01 NOTE — ED Notes (Signed)
Pt lying on stomach at this time with arms visualized. Equal and unlabored resp noted.

## 2017-11-01 NOTE — ED Notes (Signed)
Pt denies SI.  

## 2017-11-01 NOTE — ED Notes (Signed)
Pt continuing to lie on his stomach with arms visualized. Pt has unlabored resp noted, no signs of distress noted at this time.

## 2017-11-01 NOTE — ED Notes (Addendum)
With pt permission spoke to pt's grandmother, Eyvonne MechanicShirley Ellis, Rexene EdisonH: [phone] and C: [phone]  Ms Rennis Hardingllis reports for the last 2 years pt has been binge drinking (1 or 2 times per 2 weeks) pt drinks a 1/5 gallon of [employer]isky.  Pt tells family "I need help; I want to talk to someone" but doesn't get further than that, and only talks about such things when intoxicated.    Pt is on probation for Public Intoxication and drug charges.  Ms Rennis Hardingllis reports pt doesn't use drugs only dips snuff and ETOH abuse.  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  Pt called 911 tonight and told law enforcement that he was sitting in the middle of the road for cars to hit him, and that he had a knife and gun - with intention of law enforcement shooting pt when they arrived.  Pt became tearful in triage and admitted to this RN "I'm sad" but then refused to elaborate.  Pt maybe more forth coming one on one.

## 2017-11-01 NOTE — ED Notes (Signed)
SOC rescinded papers

## 2017-11-01 NOTE — ED Provider Notes (Signed)
[facility]  I accepted care from Dr. Dolores FrameSung ____________________________________________    LABS (pertinent positives/negatives)  I, Erik Rooksebecca Tyrianna Lightle, MD have personally reviewed the lab reports noted below.  Labs Reviewed  COMPREHENSIVE METABOLIC PANEL - Abnormal; Notable for the following components:      Result Value   Glucose, Bld 111 (*)    Calcium 8.7 (*)    Total Protein 8.3 (*)    All other components within normal limits  ETHANOL - Abnormal; Notable for the following components:   Alcohol, Ethyl (B) 349 (*)    All other components within normal limits  ACETAMINOPHEN LEVEL - Abnormal; Notable for the following components:   Acetaminophen (Tylenol), Serum <10 (*)    All other components within normal limits  CBC - Abnormal; Notable for the following components:   RBC 5.97 (*)    RDW 14.6 (*)    All other components within normal limits  SALICYLATE LEVEL  URINE DRUG SCREEN, QUALITATIVE ([facility] ONLY)     ____________________________________________    RADIOLOGY All xrays were viewed by me. Imaging interpreted by radiologist.  I, Erik Rooksebecca Taber Sweetser MD have personally reviewed the imaging report noted below.  None  ____________________________________________   PROCEDURES  Procedure(s) performed: None  Critical Care performed: None  ____________________________________________   INITIAL IMPRESSION / ASSESSMENT AND PLAN / ED COURSE   Pertinent labs & imaging results that were available during my care of the patient were reviewed by me and considered in my medical decision making (see chart for details).  I reviewed patient's specialist on call evaluation, patient was here for substance abuse and intoxication and suicidal ideation and is currently awake and alert and has no suicidal ideation for me.  Specialist on-call psychiatrist did recommend outpatient management and did fax over the reversal of his involuntary commitment.  I completed his  discharge instructions.     Patient / Family / Caregiver informed of clinical course, medical decision-making process, and agree with plan.   I discussed return precautions, follow-up instructions, and discharged instructions with patient and/or family.     ____________________________________________   FINAL CLINICAL IMPRESSION(S) / ED DIAGNOSES  Final diagnoses:  ETOH abuse  Alcoholic intoxication without complication (HCC)  Substance induced mood disorder (HCC)        Erik Mccall, Erik Lacek, MD 11/01/17 1150

## 2017-11-01 NOTE — ED Notes (Signed)
Pt sleeping at this time, pt on left side with equal rise and fall of chest noted.
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
## 2018-07-15 IMAGING — DX DG HAND COMPLETE 3+V*R*
1 series · 1 of 1 positions shown · non-contrast
Comparison: 01/29/2016

CLINICAL DATA: Intoxicated uncooperative. Both hands are covered
with dried blood.

EXAM:
RIGHT HAND - COMPLETE 3+ VIEW

[hand lat]
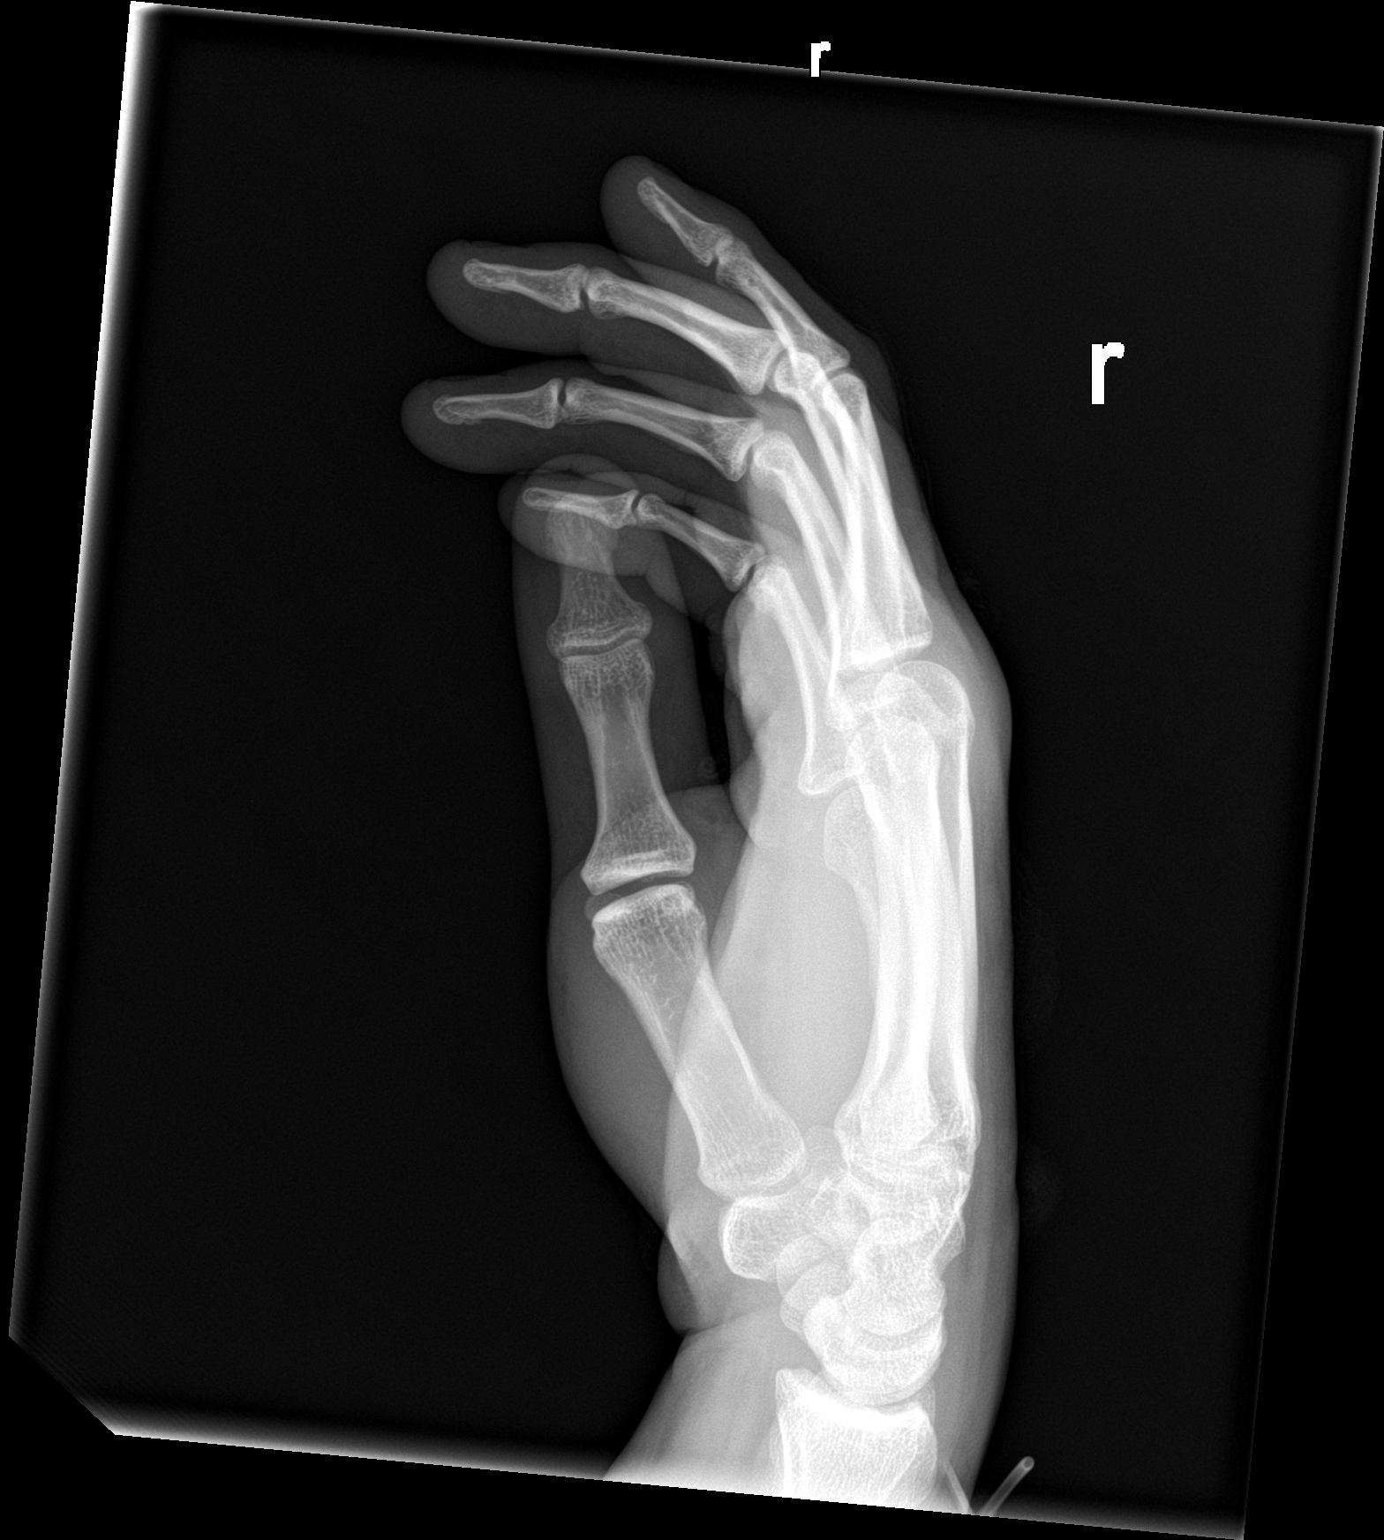

[1 of 1 positions shown; findings below may reference images not displayed]

FINDINGS: There is no evidence of fracture or dislocation. There is no
evidence of arthropathy or other focal bone abnormality. Soft
tissues are unremarkable.
IMPRESSION: Negative.

## 2020-12-13 ENCOUNTER — Emergency Department
Admission: EM | Admit: 2020-12-13 | Discharge: 2020-12-15 | Disposition: A | Discharge status: Home / Self Care | Payer: Self-pay | Attending: Emergency Medicine | Admitting: Emergency Medicine

## 2020-12-13 ENCOUNTER — Other Ambulatory Visit: Payer: Self-pay

## 2020-12-13 DIAGNOSIS — F17200 Nicotine dependence, unspecified, uncomplicated: Secondary | ICD-10-CM | POA: Insufficient documentation

## 2020-12-13 DIAGNOSIS — E876 Hypokalemia: Secondary | ICD-10-CM | POA: Insufficient documentation

## 2020-12-13 DIAGNOSIS — F1094 Alcohol use, unspecified with alcohol-induced mood disorder: Secondary | ICD-10-CM

## 2020-12-13 DIAGNOSIS — Y908 Blood alcohol level of 240 mg/100 ml or more: Secondary | ICD-10-CM | POA: Insufficient documentation

## 2020-12-13 DIAGNOSIS — Z20822 Contact with and (suspected) exposure to covid-19: Secondary | ICD-10-CM | POA: Insufficient documentation

## 2020-12-13 DIAGNOSIS — F1010 Alcohol abuse, uncomplicated: Secondary | ICD-10-CM | POA: Diagnosis present

## 2020-12-13 DIAGNOSIS — R45851 Suicidal ideations: Secondary | ICD-10-CM

## 2020-12-13 DIAGNOSIS — F332 Major depressive disorder, recurrent severe without psychotic features: Secondary | ICD-10-CM

## 2020-12-13 DIAGNOSIS — F1994 Other psychoactive substance use, unspecified with psychoactive substance-induced mood disorder: Secondary | ICD-10-CM | POA: Diagnosis present

## 2020-12-13 LAB — CBC
HCT: 48.8 % (ref 39.0–52.0)
Hemoglobin: 17.5 g/dL — ABNORMAL HIGH (ref 13.0–17.0)
MCH: 28.6 pg (ref 26.0–34.0)
MCHC: 35.9 g/dL (ref 30.0–36.0)
MCV: 79.9 fL — ABNORMAL LOW (ref 80.0–100.0)
Platelets: 372 10*3/uL (ref 150–400)
RBC: 6.11 MIL/uL — ABNORMAL HIGH (ref 4.22–5.81)
RDW: 12.8 % (ref 11.5–15.5)
WBC: 9.2 10*3/uL (ref 4.0–10.5)
nRBC: 0 % (ref 0.0–0.2)

## 2020-12-13 LAB — URINE DRUG SCREEN, QUALITATIVE (ARMC ONLY)
Amphetamines, Ur Screen: NOT DETECTED
Barbiturates, Ur Screen: NOT DETECTED
Benzodiazepine, Ur Scrn: NOT DETECTED
Cannabinoid 50 Ng, Ur ~~LOC~~: NOT DETECTED
Cocaine Metabolite,Ur ~~LOC~~: NOT DETECTED
MDMA (Ecstasy)Ur Screen: NOT DETECTED
Methadone Scn, Ur: NOT DETECTED
Opiate, Ur Screen: NOT DETECTED
Phencyclidine (PCP) Ur S: NOT DETECTED
Tricyclic, Ur Screen: NOT DETECTED

## 2020-12-13 LAB — RESP PANEL BY RT-PCR (FLU A&B, COVID) ARPGX2
Influenza A by PCR: NEGATIVE
Influenza B by PCR: NEGATIVE
SARS Coronavirus 2 by RT PCR: NEGATIVE

## 2020-12-13 LAB — COMPREHENSIVE METABOLIC PANEL
ALT: 28 U/L (ref 0–44)
AST: 25 U/L (ref 15–41)
Albumin: 5.1 g/dL — ABNORMAL HIGH (ref 3.5–5.0)
Alkaline Phosphatase: 64 U/L (ref 38–126)
Anion gap: 14 (ref 5–15)
BUN: 6 mg/dL (ref 6–20)
CO2: 19 mmol/L — ABNORMAL LOW (ref 22–32)
Calcium: 8.6 mg/dL — ABNORMAL LOW (ref 8.9–10.3)
Chloride: 104 mmol/L (ref 98–111)
Creatinine, Ser: 0.88 mg/dL (ref 0.61–1.24)
GFR, Estimated: >60 mL/min (ref >60)
Glucose, Bld: 139 mg/dL — ABNORMAL HIGH (ref 70–99)
Potassium: 3.1 mmol/L — ABNORMAL LOW (ref 3.5–5.1)
Sodium: 137 mmol/L (ref 135–145)
Total Bilirubin: 0.6 mg/dL (ref 0.3–1.2)
Total Protein: 8.3 g/dL — ABNORMAL HIGH (ref 6.5–8.1)

## 2020-12-13 LAB — MAGNESIUM: Magnesium: 2.2 mg/dL (ref 1.7–2.4)

## 2020-12-13 LAB — ETHANOL: Alcohol, Ethyl (B): 264 mg/dL — ABNORMAL HIGH (ref <10)

## 2020-12-13 LAB — ACETAMINOPHEN LEVEL: Acetaminophen (Tylenol), Serum: <10 ug/mL — ABNORMAL LOW (ref 10–30)

## 2020-12-13 LAB — SALICYLATE LEVEL: Salicylate Lvl: <7 mg/dL — ABNORMAL LOW (ref 7.0–30.0)

## 2020-12-13 MED ORDER — LORAZEPAM 2 MG PO TABS
0.0000 mg | ORAL_TABLET | Freq: Four times a day (QID) | ORAL | Status: AC
  Filled 2020-12-13: qty 1

## 2020-12-13 MED ORDER — POTASSIUM CHLORIDE CRYS ER 20 MEQ PO TBCR: 40.0000 meq | EXTENDED_RELEASE_TABLET | Freq: Once | ORAL | Status: DC

## 2020-12-13 MED ORDER — LORAZEPAM 2 MG/ML IJ SOLN: 0.0000 mg | Freq: Four times a day (QID) | INTRAMUSCULAR | Status: AC

## 2020-12-13 MED ORDER — LORAZEPAM 2 MG PO TABS: 0.0000 mg | ORAL_TABLET | Freq: Two times a day (BID) | ORAL | Status: DC

## 2020-12-13 MED ORDER — THIAMINE HCL 100 MG PO TABS: 100.0000 mg | ORAL_TABLET | Freq: Every day | ORAL | Status: DC

## 2020-12-13 MED ORDER — THIAMINE HCL 100 MG/ML IJ SOLN: 100.0000 mg | Freq: Every day | INTRAMUSCULAR | Status: DC

## 2020-12-13 MED ORDER — ZIPRASIDONE MESYLATE 20 MG IM SOLR
20.0000 mg | Freq: Once | INTRAMUSCULAR | Status: AC
  Administered 2020-12-13: 20 mg via INTRAMUSCULAR

## 2020-12-13 MED ORDER — LORAZEPAM 2 MG/ML IJ SOLN: 0.0000 mg | Freq: Two times a day (BID) | INTRAMUSCULAR | Status: DC

## 2020-12-13 NOTE — ED Notes (Signed)
Hourly rounding reveals patient in room. No complaints, stable, in no acute distress. Q15 minute rounds and monitoring via [employer] to continue. 

## 2020-12-13 NOTE — ED Notes (Signed)
Snack and beverage given. 
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
## 2020-12-13 NOTE — ED Notes (Addendum)
When asked about SI, pt only answered with, "I want to go home."  Pt told the psychiatrist would be over to see him .

## 2020-12-13 NOTE — BH Assessment (Signed)
Comprehensive Clinical Assessment (CCA) Note  12/13/2020 Erik Mccall [id number]   Erik Mccall, 30 year old male who presents to [facility] ED involuntarily for treatment. Per triage note, pt presents to the emergency department with the sheriff's department for concerns for suicidal ideation and alcohol abuse. [facility] department reports the patient had been drinking alcohol tonight and had a gun. He told his brother who is with the [employer] that he wanted to "end it all". Per the sheriff's department, he has repeatedly asked for them to shoot him. Patient is uncooperative and will not answer questions for me at this time.    During TTS assessment pt presents alert and oriented x 4, restless, uncooperative, and mood-congruent with affect. The pt does not appear to be responding to internal or external stimuli. Neither is the pt presenting with any delusional thinking. Pt would not verify the information provided to triage MD stating he did not remember anything.   Pt reports he is unsure why he is in the ER. In the beginning of the assessment, patient was not forthcoming with information. Patient only admitted that he had been drinking with friends and playing video games. Patient denied SI and stated he was trying to "show his brother his gun." As the interview continued, patient reported he has been depressed for quite some time. Patient states he is unemployed, lives with his family (grandparents, mom, and brother) and not motivated to do anything. Patient reported that 3years ago he was court-ordered to attend classes at [facility] due to a DUI conviction. At that time, after speaking with a therapist, he was prescribed anti-depressant medication that he states he only took for 2 weeks. Patient minimized his drinking and denies any other drug use. Pt reports no INPT hx and OPT hx with [facility]. Pt denies HI/AH/VH. Patient has access to several firearms and his brother is employed with the  police department.    Per Dr. Toni Amend pt is recommended for inpatient treatment.     Chief Complaint:  Chief Complaint  Patient presents with  . Mental Health Problem   Visit Diagnosis: Severe recurrent major depression without psychotic features    CCA Screening, Triage and Referral (STR)  Patient Reported Information How did you hear about [place]? Family/Friend (Patient's brother who is employed by [employer] brought patient to ER.)  Referral name: No data recorded Referral phone number: No data recorded  Whom do you see for routine medical problems? No data recorded Practice/Facility Name: No data recorded Practice/Facility Phone Number: No data recorded Name of Contact: No data recorded Contact Number: No data recorded Contact Fax Number: No data recorded Prescriber Name: No data recorded Prescriber Address (if known): No data recorded  What Is the Reason for Your Visit/Call Today? Patient reports he is unsure why he is at the ED.  How Long Has This Been Causing You Problems? > than 6 months  What Do You Feel Would Help You the Most Today? -- (Patient states he wants to be discharged and does not understand why he is here.)   Have You Recently Been in Any Inpatient Treatment (Hospital/Detox/Crisis Center/28-Day Program)? No  Name/Location of Program/Hospital:No data recorded How Long Were You There? No data recorded When Were You Discharged? No data recorded  Have You Ever Received Services From [facility] Before? Yes  Who Do You See at [facility]? No data recorded  Have You Recently Had Any Thoughts About Hurting Yourself? No (Patient denies IVC report.)  Are You  Planning to Commit Suicide/Harm Yourself At This time? No   Have you Recently Had Thoughts About Hurting Someone Karolee Ohs? No  Explanation: No data recorded  Have You Used Any Alcohol or Drugs in the Past 24 Hours? Yes  How Long Ago Did You Use Drugs or Alcohol? No data recorded What Did You Use and How  Much? " I dont know how much I drank."   Do You Currently Have a Therapist/Psychiatrist? No  Name of Therapist/Psychiatrist: No data recorded  Have You Been Recently Discharged From Any Office Practice or Programs? No  Explanation of Discharge From Practice/Program: No data recorded    CCA Screening Triage Referral Assessment Type of Contact: Face-to-Face  Is this Initial or Reassessment? No data recorded Date Telepsych consult ordered in [facility]:  No data recorded Time Telepsych consult ordered in [facility]:  No data recorded  Patient Reported Information Reviewed? No (Patient reports he does not remember and was too drunk.)  Patient Left Without Being Seen? No  Reason for Not Completing Assessment: No data recorded  Collateral Involvement: No data recorded  Does Patient Have a Court Appointed Legal Guardian? No data recorded Name and Contact of Legal Guardian: No data recorded If Minor and Not Living with Parent(s), Who has Custody? No data recorded Is CPS involved or ever been involved? Never  Is APS involved or ever been involved? Never   Patient Determined To Be At Risk for Harm To Self or Others Based on Review of Patient Reported Information or Presenting Complaint? Yes, for Self-Harm (Patient reports he owns 3 guns and was brought to ED because he was brandishing the gun wanting the police to shoot him.)  Method: No data recorded Availability of Means: No data recorded Intent: No data recorded Notification Required: No data recorded Additional Information for Danger to Others Potential: No data recorded Additional Comments for Danger to Others Potential: No data recorded Are There Guns or Other Weapons in Your Home? No data recorded Types of Guns/Weapons: No data recorded Are These Weapons Safely Secured?                            No data recorded Who Could Verify You Are Able To Have These Secured: No data recorded Do You Have any Outstanding Charges, Pending Court  Dates, Parole/Probation? No data recorded Contacted To Inform of Risk of Harm To Self or Others: No data recorded  Location of Assessment: [facility] ED   Does Patient Present under Involuntary Commitment? Yes  IVC Papers Initial File Date: 12/13/2020   [place] of Residence:    Patient Currently Receiving the Following Services: Not Receiving Services   Determination of Need: Urgent (48 hours)   Options For Referral: Medication Management; Inpatient Hospitalization; Outpatient Therapy     CCA Biopsychosocial Intake/Chief Complaint:  Patient was brought in by [employer] due to patient being heavily intoxicated, brandishing a gun and wanting the police to shoot him.  Current Symptoms/Problems: depression, irritable, no motivation, hopelessness   Patient Reported Schizophrenia/Schizoaffective Diagnosis in Past: No   Strengths: unk  Preferences: unk  Abilities: can play the guitar   Type of Services Patient Feels are Needed: Patient wants to be discharged. Patient minimizes his feelings.   Initial Clinical Notes/Concerns: No data recorded  Mental Health Symptoms Depression:  Hopelessness; Irritability; Worthlessness   Duration of Depressive symptoms: Greater than two weeks   Mania:  None   Anxiety:   Irritability   Psychosis:  None   Duration of Psychotic symptoms: No data recorded  Trauma:  N/A   Obsessions:  None   Compulsions:  No data recorded  Inattention:  Avoids/dislikes activities that require focus   Hyperactivity/Impulsivity:  N/A   Oppositional/Defiant Behaviors:  Angry; Aggression towards people/animals   Emotional Irregularity:  Intense/unstable relationships; Recurrent suicidal behaviors/gestures/threats   Other Mood/Personality Symptoms:  Not motivated, hopelessness, irritable    Mental Status Exam Appearance and self-care  Stature:  Tall   Weight:  Average weight   Clothing:  Casual   Grooming:  Normal   Cosmetic use:  None    Posture/gait:  Normal   Motor activity:  Restless; Agitated   Sensorium  Attention:  Inattentive   Concentration:  Normal   Orientation:  X5   Recall/memory:  Defective in Recent   Affect and Mood  Affect:  Depressed; Flat   Mood:  Depressed; Irritable; Pessimistic   Relating  Eye contact:  Avoided   Facial expression:  Depressed   Attitude toward examiner:  Guarded   Thought and Language  Speech flow: Normal   Thought content:  Appropriate to Mood and Circumstances   Preoccupation:  None   Hallucinations:  None   Organization:  No data recorded  Affiliated Computer ServicesExecutive Functions  Fund of Knowledge:  Average   Intelligence:  Average   Abstraction:  Concrete   Judgement:  Poor   Reality Testing:  Adequate   Insight:  Lacking   Decision Making:  Impulsive   Social Functioning  Social Maturity:  Irresponsible   Social Judgement:  Victimized   Stress  Stressors:  Work   Coping Ability:  Contractorxhausted   Skill Deficits:  Communication; Decision making; Responsibility   Supports:  Family     Religion:    Leisure/Recreation: Leisure / Recreation Do You Have Hobbies?: Yes Leisure and Hobbies: Product/process development scientistlay guitar  Exercise/Diet: Exercise/Diet Do You Exercise?: No Have You Gained or Lost A Significant Amount of Weight in the Past Six Months?: No Do You Follow a Special Diet?: No Do You Have Any Trouble Sleeping?: No   CCA Employment/Education Employment/Work Situation: Employment / Work Psychologist, occupationalituation Employment situation: Unemployed  Education: Education Is Patient Currently Attending School?: No   CCA Family/Childhood History Family and Relationship History: Family history Marital status: Single Does patient have children?: No  Childhood History:  Childhood History By whom was/is the patient raised?: Mother Does patient have siblings?: Yes Number of Siblings: 3  Child/Adolescent Assessment:     CCA Substance Use Alcohol/Drug Use: Alcohol / Drug  Use Pain Medications: See PTA Prescriptions: See PTA Over the Counter: See PTA History of alcohol / drug use?: Yes Longest period of sobriety (when/how long): Unable to quantify Negative Consequences of Use: Financial,Legal,Personal relationships,Work / School Substance #1 Name of Substance 1: Alcohol 1 - Age of First Use: unk 1 - Amount (size/oz): unk 1 - Frequency: 2-3 times a week 1 - Duration: unk 1 - Last Use / Amount: 12/13/20 1- Route of Use: oral                   ASAM's:  Six Dimensions of Multidimensional Assessment  Dimension 1:  Acute Intoxication and/or Withdrawal Potential:      Dimension 2:  Biomedical Conditions and Complications:      Dimension 3:  Emotional, Behavioral, or Cognitive Conditions and Complications:     Dimension 4:  Readiness to Change:     Dimension 5:  Relapse, Continued use, or Continued Problem Potential:  Dimension 6:  Recovery/Living Environment:     ASAM Severity Score:    ASAM Recommended Level of Treatment:     Substance use Disorder (SUD)    Recommendations for Services/Supports/Treatments:    DSM5 Diagnoses: Patient Active Problem List   Diagnosis Date Noted  . Suicidal ideation 12/13/2020  . Severe recurrent major depression without psychotic features (HCC) 12/13/2020  . Alcohol abuse 09/05/2017  . Substance induced mood disorder (HCC) 09/05/2017    Patient Centered Plan: Patient is on the following Treatment Plan(s):  Depression   Referrals to Alternative Service(s): Referred to Alternative Service(s):   Place:   Date:   Time:    Referred to Alternative Service(s):   Place:   Date:   Time:    Referred to Alternative Service(s):   Place:   Date:   Time:    Referred to Alternative Service(s):   Place:   Date:   Time:     Jaimon Bugaj Dierdre Searles, Counselor, LCAS-A

## 2020-12-13 NOTE — Consult Note (Signed)
[facility] Face-to-Face Psychiatry Consult   Reason for Consult: Consult for 30 year old man presented under IVC filed by family reporting intense and worrisome suicidal statements Referring Physician: Katrinka Blazing Patient Identification: Erik Mccall MRN:  [id number] Principal Diagnosis: Severe recurrent major depression without psychotic features (HCC) Diagnosis:  Principal Problem:   Severe recurrent major depression without psychotic features (HCC) Active Problems:   Alcohol abuse   Substance induced mood disorder (HCC)   Suicidal ideation   Total Time spent with patient: 1 hour  Subjective:   Erik Mccall is a 30 y.o. male patient admitted with "I do not know".  HPI: Patient seen chart reviewed.  Patient was brought to the hospital last night under IVC papers filed by brother.  Paperwork states that the patient had been waking up the family very agitated and upset talking about how he was going to kill himself.  Brandishing a gun which had to be taken away from him.  Then told the family that he had other guns and a crossbow.  Asked police officers to shoot him when they showed up.  Patient has been sleeping all day today.  During the interview he was mostly uncooperative.  Flat affect.  Insist that he has no memory of what happened last night.  Admits that he was drinking.  Tells me he does not know how much she had to drink because "I do not keep count".  Denies that he was using other drugs.  Patient has I said he is minimizing the situation and is uncooperative.  Denies any current depression but looks very down and irritable.  Will not give much information about his life or what stresses have been going on.  Claims to have no thought of harming himself now.  Does not engage in appropriate dialogue but insists multiple times on simply being discharged.  Currently not showing any signs of acute withdrawal.  Does not appear psychotic. Past Psychiatric History: Patient has had similar  presentations like this in the past of becoming acutely extremely suicidal and trying to have police kill him when he was intoxicated.  He has had extensive problems with alcohol including more than 1 DUI with court mandated treatment.  Had been referred to [facility] in the past and prescribed medication for depression but was not cooperative  Risk to Self:   Risk to Others:   Prior Inpatient Therapy:   Prior Outpatient Therapy:    Past Medical History: History reviewed. No pertinent past medical history. History reviewed. No pertinent surgical history. Family History: No family history on file. Family Psychiatric  History: Does not know of any Social History:  Social History   Substance and Sexual Activity  Alcohol Use Yes     Social History   Substance and Sexual Activity  Drug Use Not on file    Social History   Socioeconomic History  . Marital status: Single    Spouse name: Not on file  . Number of children: Not on file  . Years of education: Not on file  . Highest education level: Not on file  Occupational History  . Not on file  Tobacco Use  . Smoking status: Current Some Day Smoker  . Smokeless tobacco: Current User    Types: Chew, Snuff  Substance and Sexual Activity  . Alcohol use: Yes  . Drug use: Not on file  . Sexual activity: Yes  Other Topics Concern  . Not on file  Social History Narrative  . Not on file  Social Determinants of Health   Financial Resource Strain: Not on file  Food Insecurity: Not on file  Transportation Needs: Not on file  Physical Activity: Not on file  Stress: Not on file  Social Connections: Not on file   Additional Social History:    Allergies:  No Known Allergies  Labs:  Results for orders placed or performed during the hospital encounter of 12/13/20 (from the past 48 hour(s))  Urine Drug Screen, Qualitative     Status: None   Collection Time: 12/13/20  5:30 AM  Result Value Ref Range   Tricyclic, Ur Screen NONE DETECTED  NONE DETECTED   Amphetamines, Ur Screen NONE DETECTED NONE DETECTED   MDMA (Ecstasy)Ur Screen NONE DETECTED NONE DETECTED   Cocaine Metabolite,Ur [place] NONE DETECTED NONE DETECTED   Opiate, Ur Screen NONE DETECTED NONE DETECTED   Phencyclidine (PCP) Ur S NONE DETECTED NONE DETECTED   Cannabinoid 50 Ng, Ur [place] NONE DETECTED NONE DETECTED   Barbiturates, Ur Screen NONE DETECTED NONE DETECTED   Benzodiazepine, Ur Scrn NONE DETECTED NONE DETECTED   Methadone Scn, Ur NONE DETECTED NONE DETECTED    Comment: (NOTE) Tricyclics + metabolites, urine    Cutoff 1000 ng/mL Amphetamines + metabolites, urine  Cutoff 1000 ng/mL MDMA (Ecstasy), urine              Cutoff 500 ng/mL Cocaine Metabolite, urine          Cutoff 300 ng/mL Opiate + metabolites, urine        Cutoff 300 ng/mL Phencyclidine (PCP), urine         Cutoff 25 ng/mL Cannabinoid, urine                 Cutoff 50 ng/mL Barbiturates + metabolites, urine  Cutoff 200 ng/mL Benzodiazepine, urine              Cutoff 200 ng/mL Methadone, urine                   Cutoff 300 ng/mL  The urine drug screen provides only a preliminary, unconfirmed analytical test result and should not be used for non-medical purposes. Clinical consideration and professional judgment should be applied to any positive drug screen result due to possible interfering substances. A more specific alternate chemical method must be used in order to obtain a confirmed analytical result. Gas chromatography / mass spectrometry (GC/MS) is the preferred confirm atory method. Performed at [facility], [address] [place], [place] [address]   Comprehensive metabolic panel     Status: Abnormal   Collection Time: 12/13/20  5:58 AM  Result Value Ref Range   Sodium 137 135 - 145 mmol/L   Potassium 3.1 (L) 3.5 - 5.1 mmol/L   Chloride 104 98 - 111 mmol/L   CO2 19 (L) 22 - 32 mmol/L   Glucose, Bld 139 (H) 70 - 99 mg/dL    Comment: Glucose reference range applies only to  samples taken after fasting for at least 8 hours.   BUN 6 6 - 20 mg/dL   Creatinine, Ser 2.95 0.61 - 1.24 mg/dL   Calcium 8.6 (L) 8.9 - 10.3 mg/dL   Total Protein 8.3 (H) 6.5 - 8.1 g/dL   Albumin 5.1 (H) 3.5 - 5.0 g/dL   AST 25 15 - 41 U/L   ALT 28 0 - 44 U/L   Alkaline Phosphatase 64 38 - 126 U/L   Total Bilirubin 0.6 0.3 - 1.2 mg/dL   GFR, Estimated >28 >  60 mL/min    Comment: (NOTE) Calculated using the CKD-EPI Creatinine Equation (2021)    Anion gap 14 5 - 15    Comment: Performed at [facility], [address] [place], [place] [address]  Ethanol     Status: Abnormal   Collection Time: 12/13/20  5:58 AM  Result Value Ref Range   Alcohol, Ethyl (B) 264 (H) <10 mg/dL    Comment: (NOTE) Lowest detectable limit for serum alcohol is 10 mg/dL.  For medical purposes only. Performed at [facility], [address] [place], [place] [address]   Salicylate level     Status: Abnormal   Collection Time: 12/13/20  5:58 AM  Result Value Ref Range   Salicylate Lvl <7.0 (L) 7.0 - 30.0 mg/dL    Comment: Performed at [facility], [address] [place], [place] [address]  Acetaminophen level     Status: Abnormal   Collection Time: 12/13/20  5:58 AM  Result Value Ref Range   Acetaminophen (Tylenol), Serum <10 (L) 10 - 30 ug/mL    Comment: (NOTE) Therapeutic concentrations vary significantly. A range of 10-30 ug/mL  may be an effective concentration for many patients. However, some  are best treated at concentrations outside of this range. Acetaminophen concentrations >150 ug/mL at 4 hours after ingestion  and >50 ug/mL at 12 hours after ingestion are often associated with  toxic reactions.  Performed at [facility], [address] [place], [place] [address]   cbc     Status: Abnormal   Collection Time: 12/13/20  5:58 AM  Result Value Ref Range   WBC 9.2 4.0 - 10.5 K/uL   RBC 6.11 (H) 4.22 - 5.81 MIL/uL   Hemoglobin 17.5 (H) 13.0 - 17.0  g/dL   HCT 65.7 84.6 - 96.2 %   MCV 79.9 (L) 80.0 - 100.0 fL   MCH 28.6 26.0 - 34.0 pg   MCHC 35.9 30.0 - 36.0 g/dL   RDW 95.2 84.1 - 32.4 %   Platelets 372 150 - 400 K/uL   nRBC 0.0 0.0 - 0.2 %    Comment: Performed at [facility], [address], [place], [place] [address]  Magnesium     Status: None   Collection Time: 12/13/20  5:58 AM  Result Value Ref Range   Magnesium 2.2 1.7 - 2.4 mg/dL    Comment: Performed at [facility] [address], [place], [place] [address]    Current Facility-Administered Medications  Medication Dose Route Frequency Provider Last Rate Last Admin  . LORazepam (ATIVAN) injection 0-4 mg  0-4 mg Intravenous Q6H Ward, Kristen N, DO       Or  . LORazepam (ATIVAN) tablet 0-4 mg  0-4 mg Oral Q6H Ward, Kristen N, DO      . [START ON 12/15/2020] LORazepam (ATIVAN) injection 0-4 mg  0-4 mg Intravenous Q12H Ward, Kristen N, DO       Or  . Melene Muller ON 12/15/2020] LORazepam (ATIVAN) tablet 0-4 mg  0-4 mg Oral Q12H Ward, Kristen N, DO      . potassium chloride SA (KLOR-CON) CR tablet 40 mEq  40 mEq Oral Once Ward, Kristen N, DO      . thiamine tablet 100 mg  100 mg Oral Daily Ward, Kristen N, DO       Or  . thiamine (B-1) injection 100 mg  100 mg Intravenous Daily Ward, Kristen N, DO       No current outpatient  medications on file.    Musculoskeletal: Strength & Muscle Tone: within normal limits Gait & Station: normal Patient leans: N/A  Psychiatric Specialty Exam: Physical Exam Vitals and nursing note reviewed.  Constitutional:      Appearance: He is well-developed and well-nourished.  HENT:     Head: Normocephalic and atraumatic.  Eyes:     Conjunctiva/sclera: Conjunctivae normal.     Pupils: Pupils are equal, round, and reactive to light.  Cardiovascular:     Heart sounds: Normal heart sounds.  Pulmonary:     Effort: Pulmonary effort is normal.  Abdominal:     Palpations: Abdomen is soft.  Musculoskeletal:        General:  Normal range of motion.     Cervical back: Normal range of motion.  Skin:    General: Skin is warm and dry.  Neurological:     General: No focal deficit present.     Mental Status: He is alert.  Psychiatric:        Attention and Perception: He is inattentive.        Mood and Affect: Affect is blunt, angry and inappropriate.        Speech: Speech is delayed.        Behavior: Behavior is slowed and withdrawn.        Thought Content: Thought content includes suicidal ideation. Thought content does not include homicidal ideation. Thought content includes suicidal plan.        Cognition and Memory: Cognition is impaired. Memory is impaired.        Judgment: Judgment is inappropriate.     Review of Systems  Constitutional: Negative.   HENT: Negative.   Eyes: Negative.   Respiratory: Negative.   Cardiovascular: Negative.   Gastrointestinal: Negative.   Musculoskeletal: Negative.   Skin: Negative.   Neurological: Negative.   Psychiatric/Behavioral: Positive for dysphoric mood and suicidal ideas. The patient is nervous/anxious.     Blood pressure 135/87, pulse (!) 134, temperature 98.3 F (36.8 C), temperature source Oral, resp. rate 20, height 6\' 4"  (1.93 m), weight 127 kg, SpO2 97 %.Body mass index is 34.08 kg/m.  General Appearance: Casual  Eye Contact:  Fair  Speech:  Slow  Volume:  Decreased  Mood:  Depressed  Affect:  Congruent  Thought Process:  Linear  Orientation:  Negative  Thought Content:  Illogical  Suicidal Thoughts:  Yes.  with intent/plan  Homicidal Thoughts:  No  Memory:  Immediate;   Fair Recent;   Poor Remote;   Poor  Judgement:  Impaired  Insight:  Lacking  Psychomotor Activity:  Decreased  Concentration:  Concentration: Poor  Recall:  Poor  Fund of Knowledge:  Poor  Language:  Poor  Akathisia:  No  Handed:  Right  AIMS (if indicated):     Assets:  Housing Social Support  ADL's:  Impaired  Cognition:  Impaired,  Mild  Sleep:        Treatment  Plan Summary: Plan 30 year old man with depressed affect intense recent suicidal ideation multiple presentations with suicidal ideation poor insight poor follow-up and ongoing alcohol abuse.  Patient continues to pose significant risk of suicide.  I do not feel comfortable discharging him in his current condition with his poor insight and judgment and lack of cooperation.  Continue IVC and I have asked TTS to refer the patient out and try to find an inpatient psychiatric bed as we have none available at this time.  Disposition: Recommend psychiatric Inpatient admission when medically  cleared. Supportive therapy provided about ongoing stressors.  Mordecai Rasmussen, MD 12/13/2020 3:38 PM

## 2020-12-13 NOTE — ED Triage Notes (Addendum)
Pt to ED [facility], not compliant with answering questions at this time. Provided a urine sample and vital signs willingly.   Pt tearful in triage

## 2020-12-13 NOTE — ED Notes (Signed)
Pt refusing to talk with medical staff. Pt continue to go back and forth with [facility]. Per ED provider, Raquel RN administered 20 mg geodon with assistance of ACS.

## 2020-12-13 NOTE — ED Notes (Signed)
Pt moved to [facility].

## 2020-12-13 NOTE — BH Assessment (Signed)
Patient still sleeping. Could not be awaken by provider. Will try back later.

## 2020-12-13 NOTE — ED Notes (Signed)
Meal tray placed in room 

## 2020-12-13 NOTE — ED Notes (Signed)
Pt agitated and wanting to leave. Refusing VS at this time.

## 2020-12-13 NOTE — BH Assessment (Signed)
Referral information for Psychiatric Hospitalization faxed to:   [facility] ([phone]- [phone]),    [facility] ([phone]),    [facility] ([phone] -or- [phone]),    [facility] [phone]- [phone])  [phone]   [place] ([phone]),    [facility] ([phone] or [phone])   [facility] ([phone] or [phone])   [facility] ([phone] or [phone]),    [facility] ([phone]). 

## 2020-12-13 NOTE — BH Assessment (Signed)
Referral Checks:   Erik Mccall ([phone]- [phone]), Erik Mccall reports denied due to no insurance   [facility] [phone]), No answer   Erik Mccall [phone] -or- [phone]), Erik Mccall reports denied due to no insurance    [facility] [phone]) [phone]   Erik Mccall [phone]), No answer at any of the 3 numbers listed   [facility] [phone] or [phone]) Not accepting outside referrals currently   [facility] [phone] or [phone]) No answer, voicemail was left   [place] [facility] [phone] or [phone]),   Erik Mccall [phone]). No answer, voicemail was left

## 2020-12-13 NOTE — ED Notes (Signed)
Pt agreed to dress out.   Pt belongs secured:   1 gray tshirt 1 gray pants  1 underwear 1 pair black boots

## 2020-12-13 NOTE — ED Notes (Signed)
Report to include Situation, Background, Assessment, and Recommendations received from Jeannette RN. Patient alert and oriented, warm and dry, in no acute distress. Patient denies SI, HI, AVH and pain. Patient made aware of Q15 minute rounds and security cameras for their safety. Patient instructed to come to me with needs or concerns.  

## 2020-12-14 NOTE — ED Notes (Signed)
Hourly rounding reveals patient in room. No complaints, stable, in no acute distress. Q15 minute rounds and monitoring via [employer] to continue. 

## 2020-12-14 NOTE — ED Notes (Signed)
IVC, pend placement 

## 2020-12-14 NOTE — ED Notes (Signed)

## 2020-12-14 NOTE — ED Provider Notes (Signed)
Emergency Medicine Observation Re-evaluation Note  Erik Mccall is a 30 y.o. male, seen on rounds today.  Pt initially presented to the ED for complaints of Mental Health Problem Currently, the patient is resting.  Physical Exam  BP (!) 141/76 (BP Location: Right Arm)   Pulse 75   Temp 98.2 F (36.8 C) (Oral)   Resp 18   Ht 6\' 4"  (1.93 m)   Wt 127 kg   SpO2 97%   BMI 34.08 kg/m  Physical Exam General: NAD Cardiac: well perfused Lungs: even and unlabored Psych: currently calm  ED Course / MDM  EKG:    I have reviewed the labs performed to date as well as medications administered while in observation.  Recent changes in the last 24 hours include none.  Plan  Current plan is for psych dispo. Patient is under full IVC at this time.   , MD 12/14/20 [phone]

## 2020-12-14 NOTE — ED Notes (Signed)
VS not taken, Patient asleep. 

## 2020-12-14 NOTE — ED Notes (Signed)
No vitals at this time patient sleeping

## 2020-12-15 MED ORDER — FLUOXETINE HCL 20 MG PO CAPS: 20.0000 mg | ORAL_CAPSULE | Freq: Every day | ORAL | 1 refills | Status: AC

## 2020-12-15 NOTE — ED Provider Notes (Signed)
Emergency Medicine Observation Re-evaluation Note  Erik Mccall is a 30 y.o. male, seen on rounds today.  Pt initially presented to the ED for complaints of Mental Health Problem  Currently, the patient is calm, no acute complaints.  Physical Exam  Blood pressure (!) 163/88, pulse 80, temperature 98.2 F (36.8 C), temperature source Oral, resp. rate 18, height 6\' 4"  (1.93 m), weight 127 kg, SpO2 98 %. Physical Exam General: NAD Lungs: Unlabored breathing Psych: not agitated  ED Course / MDM  EKG:    I have reviewed the labs performed to date as well as medications administered while in observation.  Recent changes in the last 24 hours include no acute events overnight.    Plan  Current plan is for inpatient psychiatric care, awaiting placement. Patient is under full IVC at this time.   , MD 12/15/20 [phone]

## 2020-12-15 NOTE — Consult Note (Signed)
[facility] Face-to-Face Psychiatry Consult   Reason for Consult: Follow-up consult for 30 year old man who has been in the emergency room after suicidal threats while intoxicated Referring Physician: Scotty Court Patient Identification: Erik Mccall MRN:  [id number] Principal Diagnosis: Severe recurrent major depression without psychotic features (HCC) Diagnosis:  Principal Problem:   Severe recurrent major depression without psychotic features (HCC) Active Problems:   Alcohol abuse   Substance induced mood disorder (HCC)   Suicidal ideation   Total Time spent with patient: 30 minutes  Subjective:   Erik Mccall is a 30 y.o. male patient admitted with "I am okay".  HPI: Patient seen chart reviewed.  Patient has consistently denied any suicidal ideation since I first saw him.  He is detoxed completely and having no symptoms of alcohol withdrawal.  Affect continues to be blunted but he is taking care of his ADLs and eating okay.  He denies any suicidal thoughts at all.  He is not very easy to engage in conversation and does not seem particularly concerned about his recent behavior.  He has expressed that he does not want to get engaged in any kind of substance abuse treatment at this time.  Patient does not want to be admitted to the psychiatric unit and in the current situation is unlikely to benefit from it  Past Psychiatric History: Past history of similar behaviors alcohol abuse he has engaged in treatment in the past.  Risk to Self:   Risk to Others:   Prior Inpatient Therapy:   Prior Outpatient Therapy:    Past Medical History: History reviewed. No pertinent past medical history. History reviewed. No pertinent surgical history. Family History: No family history on file. Family Psychiatric  History: See previous Social History:  Social History   Substance and Sexual Activity  Alcohol Use Yes     Social History   Substance and Sexual Activity  Drug Use Not on file     Social History   Socioeconomic History  . Marital status: Single    Spouse name: Not on file  . Number of children: Not on file  . Years of education: Not on file  . Highest education level: Not on file  Occupational History  . Not on file  Tobacco Use  . Smoking status: Current Some Day Smoker  . Smokeless tobacco: Current User    Types: Chew, Snuff  Substance and Sexual Activity  . Alcohol use: Yes  . Drug use: Not on file  . Sexual activity: Yes  Other Topics Concern  . Not on file  Social History Narrative  . Not on file   Social Determinants of Health   Financial Resource Strain: Not on file  Food Insecurity: Not on file  Transportation Needs: Not on file  Physical Activity: Not on file  Stress: Not on file  Social Connections: Not on file   Additional Social History:    Allergies:  No Known Allergies  Labs:  Results for orders placed or performed during the hospital encounter of 12/13/20 (from the past 48 hour(s))  Resp Panel by RT-PCR (Flu A&B, Covid) Nasopharyngeal Swab     Status: None   Collection Time: 12/13/20  3:16 PM   Specimen: Nasopharyngeal Swab; Nasopharyngeal(NP) swabs in vial transport medium  Result Value Ref Range   SARS Coronavirus 2 by RT PCR NEGATIVE NEGATIVE    Comment: (NOTE) SARS-CoV-2 target nucleic acids are NOT DETECTED.  The SARS-CoV-2 RNA is generally detectable in upper respiratory specimens during the acute phase  of infection. The lowest concentration of SARS-CoV-2 viral copies this assay can detect is 138 copies/mL. A negative result does not preclude SARS-Cov-2 infection and should not be used as the sole basis for treatment or other patient management decisions. A negative result may occur with  improper specimen collection/handling, submission of specimen other than nasopharyngeal swab, presence of viral mutation(s) within the areas targeted by this assay, and inadequate number of viral copies(<138 copies/mL). A negative  result must be combined with clinical observations, patient history, and epidemiological information. The expected result is Negative.  Fact Sheet for Patients:  BloggerCourse.com  Fact Sheet for Healthcare Providers:  SeriousBroker.it  This test is no t yet approved or cleared by the [place] FDA and  has been authorized for detection and/or diagnosis of SARS-CoV-2 by FDA under an Emergency Use Authorization (EUA). This EUA will remain  in effect (meaning this test can be used) for the duration of the COVID-19 declaration under Section 564(b)(1) of the Act, 21 U.S.C.section 360bbb-3(b)(1), unless the authorization is terminated  or revoked sooner.       Influenza A by PCR NEGATIVE NEGATIVE   Influenza B by PCR NEGATIVE NEGATIVE    Comment: (NOTE) The Xpert Xpress SARS-CoV-2/FLU/RSV plus assay is intended as an aid in the diagnosis of influenza from Nasopharyngeal swab specimens and should not be used as a sole basis for treatment. Nasal washings and aspirates are unacceptable for Xpert Xpress SARS-CoV-2/FLU/RSV testing.  Fact Sheet for Patients: BloggerCourse.com  Fact Sheet for Healthcare Providers: SeriousBroker.it  This test is not yet approved or cleared by the [place] FDA and has been authorized for detection and/or diagnosis of SARS-CoV-2 by FDA under an Emergency Use Authorization (EUA). This EUA will remain in effect (meaning this test can be used) for the duration of the COVID-19 declaration under Section 564(b)(1) of the Act, 21 U.S.C. section 360bbb-3(b)(1), unless the authorization is terminated or revoked.  Performed at [facility], [address], [place], [place] [address]     Current Facility-Administered Medications  Medication Dose Route Frequency Provider Last Rate Last Admin  . LORazepam (ATIVAN) injection 0-4 mg  0-4 mg  Intravenous Q12H Ward, Kristen N, DO       Or  . LORazepam (ATIVAN) tablet 0-4 mg  0-4 mg Oral Q12H Ward, Kristen N, DO      . potassium chloride SA (KLOR-CON) CR tablet 40 mEq  40 mEq Oral Once Ward, Kristen N, DO      . thiamine tablet 100 mg  100 mg Oral Daily Ward, Kristen N, DO       Or  . thiamine (B-1) injection 100 mg  100 mg Intravenous Daily Ward, Kristen N, DO       Current Outpatient Medications  Medication Sig Dispense Refill  . FLUoxetine (PROZAC) 20 MG capsule Take 1 capsule (20 mg total) by mouth daily. 30 capsule 1    Musculoskeletal: Strength & Muscle Tone: within normal limits Gait & Station: normal Patient leans: N/A  Psychiatric Specialty Exam: Physical Exam Vitals and nursing note reviewed.  Constitutional:      Appearance: He is well-developed and well-nourished.  HENT:     Head: Normocephalic and atraumatic.  Eyes:     Conjunctiva/sclera: Conjunctivae normal.     Pupils: Pupils are equal, round, and reactive to light.  Cardiovascular:     Heart sounds: Normal heart sounds.  Pulmonary:     Effort: Pulmonary effort is normal.  Abdominal:  Palpations: Abdomen is soft.  Musculoskeletal:        General: Normal range of motion.     Cervical back: Normal range of motion.  Skin:    General: Skin is warm and dry.  Neurological:     General: No focal deficit present.     Mental Status: He is alert.  Psychiatric:        Attention and Perception: Attention normal.        Mood and Affect: Affect is blunt.        Speech: Speech normal.        Behavior: Behavior is cooperative.        Thought Content: Thought content is not paranoid. Thought content does not include homicidal or suicidal ideation.        Cognition and Memory: Cognition is impaired.        Judgment: Judgment normal.     Review of Systems  Constitutional: Negative.   HENT: Negative.   Eyes: Negative.   Respiratory: Negative.   Cardiovascular: Negative.   Gastrointestinal: Negative.    Musculoskeletal: Negative.   Skin: Negative.   Neurological: Negative.   Psychiatric/Behavioral: Negative.     Blood pressure (!) 158/90, pulse 72, temperature 98.6 F (37 C), temperature source Oral, resp. rate 17, height 6\' 4"  (1.93 m), weight 127 kg, SpO2 98 %.Body mass index is 34.08 kg/m.  General Appearance: Casual  Eye Contact:  Good  Speech:  Clear and Coherent  Volume:  Normal  Mood:  Euthymic  Affect:  Flat  Thought Process:  Coherent  Orientation:  Full (Time, Place, and Person)  Thought Content:  Logical  Suicidal Thoughts:  No  Homicidal Thoughts:  No  Memory:  Immediate;   Fair Recent;   Fair Remote;   Fair  Judgement:  Fair  Insight:  Fair  Psychomotor Activity:  Decreased  Concentration:  Concentration: Fair  Recall:  of Knowledge:  Fair  Language:  Fair  Akathisia:  No  Handed:  Right  AIMS (if indicated):     Assets:  Desire for Improvement Housing Physical Health Resilience Social Support  ADL's:  Intact  Cognition:  WNL  Sleep:        Treatment Plan Summary: Medication management and Plan Spent time counseling the patient about depression and alcohol abuse.  Tried to encourage him to get back involved in substance abuse treatment pointing out that alcohol clearly has a terrible effect on his mood.  I also had him agree that he will try restarting an antidepressant.  Prescription written for fluoxetine 20 mg 30 days with a refill.  Side effects and benefits explained and patient is agreeable.  He has the information about follow-up at [facility].  I tried to contact his brother who filed the commitment paperwork but the phone number did not go through and there was no way to leave a voicemail.  Patient counseled repeatedly about not drinking and getting in touch with someone immediately if he has suicidal thoughts again.  Case reviewed with ER doctor and TTS.  Discontinue IVC and patient can be discharged home.  Disposition: No evidence of  imminent risk to self or others at present.   Patient does not meet criteria for psychiatric inpatient admission. Supportive therapy provided about ongoing stressors. Discussed crisis plan, support from social network, calling 911, coming to the Emergency Department, and calling Suicide Hotline.  [facility], MD 12/15/2020 11:40 AM

## 2020-12-15 NOTE — ED Notes (Signed)
IVC/Pending Placement 

## 2020-12-15 NOTE — ED Notes (Signed)
Patient discharge to home, all belonging returned. Patient verbalized understanding of discharge instructions.

## 2020-12-15 NOTE — BH Assessment (Signed)
Writer spoke with the patient to complete an updated/reassessment. Patient denies SI/HI and AV/H. 

## 2020-12-15 NOTE — ED Notes (Signed)
Breakfast provided. Refused scheduled meds.

## 2020-12-15 NOTE — ED Notes (Signed)
VOl, pend dispo
